# Patient Record
Sex: Female | Born: 1945 | Race: Black or African American | Hispanic: No | Smoking: Never smoker
Health system: Southern US, Community
[De-identification: ages and names within clinical notes are randomized; demographics above are authoritative.]

## PROBLEM LIST (undated history)

## (undated) DIAGNOSIS — I509 Heart failure, unspecified: Secondary | ICD-10-CM

## (undated) DIAGNOSIS — M797 Fibromyalgia: Secondary | ICD-10-CM

## (undated) DIAGNOSIS — I82409 Acute embolism and thrombosis of unspecified deep veins of unspecified lower extremity: Secondary | ICD-10-CM

## (undated) DIAGNOSIS — I2510 Atherosclerotic heart disease of native coronary artery without angina pectoris: Secondary | ICD-10-CM

## (undated) DIAGNOSIS — D649 Anemia, unspecified: Secondary | ICD-10-CM

## (undated) DIAGNOSIS — E785 Hyperlipidemia, unspecified: Secondary | ICD-10-CM

## (undated) DIAGNOSIS — N189 Chronic kidney disease, unspecified: Secondary | ICD-10-CM

## (undated) DIAGNOSIS — I739 Peripheral vascular disease, unspecified: Secondary | ICD-10-CM

## (undated) DIAGNOSIS — I8390 Asymptomatic varicose veins of unspecified lower extremity: Secondary | ICD-10-CM

## (undated) DIAGNOSIS — M1990 Unspecified osteoarthritis, unspecified site: Secondary | ICD-10-CM

## (undated) DIAGNOSIS — J45909 Unspecified asthma, uncomplicated: Secondary | ICD-10-CM

## (undated) DIAGNOSIS — M5430 Sciatica, unspecified side: Secondary | ICD-10-CM

## (undated) DIAGNOSIS — R238 Other skin changes: Secondary | ICD-10-CM

## (undated) DIAGNOSIS — R011 Cardiac murmur, unspecified: Secondary | ICD-10-CM

## (undated) DIAGNOSIS — S86019A Strain of unspecified Achilles tendon, initial encounter: Secondary | ICD-10-CM

## (undated) DIAGNOSIS — Z8719 Personal history of other diseases of the digestive system: Secondary | ICD-10-CM

## (undated) DIAGNOSIS — Z889 Allergy status to unspecified drugs, medicaments and biological substances status: Secondary | ICD-10-CM

## (undated) DIAGNOSIS — K219 Gastro-esophageal reflux disease without esophagitis: Secondary | ICD-10-CM

## (undated) DIAGNOSIS — Z9889 Other specified postprocedural states: Secondary | ICD-10-CM

## (undated) DIAGNOSIS — R233 Spontaneous ecchymoses: Secondary | ICD-10-CM

## (undated) DIAGNOSIS — G4733 Obstructive sleep apnea (adult) (pediatric): Secondary | ICD-10-CM

## (undated) DIAGNOSIS — I208 Other forms of angina pectoris: Secondary | ICD-10-CM

## (undated) DIAGNOSIS — E039 Hypothyroidism, unspecified: Secondary | ICD-10-CM

## (undated) DIAGNOSIS — K5900 Constipation, unspecified: Secondary | ICD-10-CM

## (undated) DIAGNOSIS — I219 Acute myocardial infarction, unspecified: Secondary | ICD-10-CM

## (undated) DIAGNOSIS — K5792 Diverticulitis of intestine, part unspecified, without perforation or abscess without bleeding: Secondary | ICD-10-CM

## (undated) DIAGNOSIS — R931 Abnormal findings on diagnostic imaging of heart and coronary circulation: Secondary | ICD-10-CM

## (undated) DIAGNOSIS — M5020 Other cervical disc displacement, unspecified cervical region: Secondary | ICD-10-CM

## (undated) DIAGNOSIS — I2089 Other forms of angina pectoris: Secondary | ICD-10-CM

## (undated) DIAGNOSIS — I10 Essential (primary) hypertension: Secondary | ICD-10-CM

## (undated) DIAGNOSIS — K5190 Ulcerative colitis, unspecified, without complications: Secondary | ICD-10-CM

## (undated) DIAGNOSIS — I5082 Biventricular heart failure: Secondary | ICD-10-CM

## (undated) HISTORY — DX: Atherosclerotic heart disease of native coronary artery without angina pectoris: I25.10

## (undated) HISTORY — PX: KNEE ARTHROSCOPY: SUR90

## (undated) HISTORY — PX: CHOLECYSTECTOMY: SHX55

## (undated) HISTORY — PX: ABDOMINAL HYSTERECTOMY: SHX81

## (undated) HISTORY — PX: MOLE REMOVAL: SHX2046

## (undated) HISTORY — PX: FOOT SURGERY: SHX648

## (undated) HISTORY — DX: Abnormal findings on diagnostic imaging of heart and coronary circulation: R93.1

## (undated) HISTORY — PX: OOPHORECTOMY: SHX86

## (undated) HISTORY — DX: Biventricular heart failure: I50.82

## (undated) HISTORY — PX: CARDIAC CATHETERIZATION: SHX172

## (undated) HISTORY — PX: HAMMER TOE SURGERY: SHX385

## (undated) HISTORY — PX: REPLACEMENT TOTAL KNEE: SUR1224

## (undated) HISTORY — PX: CYSTECTOMY: SUR359

## (undated) HISTORY — PX: BUNIONECTOMY: SHX129

## (undated) HISTORY — PX: TONSILLECTOMY: SUR1361

## (undated) HISTORY — PX: DILATION AND CURETTAGE OF UTERUS: SHX78

## (undated) HISTORY — DX: Acute embolism and thrombosis of unspecified deep veins of unspecified lower extremity: I82.409

## (undated) HISTORY — DX: Obstructive sleep apnea (adult) (pediatric): G47.33

## (undated) HISTORY — DX: Asymptomatic varicose veins of unspecified lower extremity: I83.90

---

## 2007-08-22 DIAGNOSIS — I219 Acute myocardial infarction, unspecified: Secondary | ICD-10-CM

## 2007-08-22 HISTORY — DX: Acute myocardial infarction, unspecified: I21.9

## 2007-08-22 HISTORY — PX: REVISION TOTAL KNEE ARTHROPLASTY: SUR1280

## 2012-05-10 ENCOUNTER — Other Ambulatory Visit (HOSPITAL_COMMUNITY): Payer: Self-pay | Admitting: *Deleted

## 2012-05-13 ENCOUNTER — Encounter (HOSPITAL_COMMUNITY)
Admission: RE | Admit: 2012-05-13 | Discharge: 2012-05-13 | Disposition: A | Discharge status: Home / Self Care | Payer: Medicare (Managed Care) | Source: Ambulatory Visit | Attending: Family Medicine | Admitting: Family Medicine

## 2012-05-13 DIAGNOSIS — D509 Iron deficiency anemia, unspecified: Secondary | ICD-10-CM | POA: Insufficient documentation

## 2012-05-13 MED ORDER — SODIUM CHLORIDE 0.9 % IV SOLN
INTRAVENOUS | Status: DC
  Administered 2012-05-13: 12:00:00 via INTRAVENOUS

## 2012-05-13 MED ORDER — FERUMOXYTOL INJECTION 510 MG/17 ML
INTRAVENOUS | Status: AC
  Administered 2012-05-13: 510 mg via INTRAVENOUS
  Filled 2012-05-13: qty 17

## 2012-05-13 MED ORDER — FERUMOXYTOL INJECTION 510 MG/17 ML
510.0000 mg | Freq: Once | INTRAVENOUS | Status: AC
  Administered 2012-05-13: 510 mg via INTRAVENOUS

## 2012-05-26 ENCOUNTER — Encounter (HOSPITAL_COMMUNITY): Payer: Self-pay

## 2012-05-26 ENCOUNTER — Emergency Department (HOSPITAL_COMMUNITY)
Admission: EM | Admit: 2012-05-26 | Discharge: 2012-05-26 | Disposition: A | Discharge status: Home / Self Care | Payer: Medicare (Managed Care) | Attending: Emergency Medicine | Admitting: Emergency Medicine

## 2012-05-26 ENCOUNTER — Telehealth (HOSPITAL_COMMUNITY): Payer: Self-pay | Admitting: Emergency Medicine

## 2012-05-26 DIAGNOSIS — M129 Arthropathy, unspecified: Secondary | ICD-10-CM | POA: Insufficient documentation

## 2012-05-26 DIAGNOSIS — R040 Epistaxis: Secondary | ICD-10-CM | POA: Insufficient documentation

## 2012-05-26 DIAGNOSIS — I509 Heart failure, unspecified: Secondary | ICD-10-CM | POA: Insufficient documentation

## 2012-05-26 DIAGNOSIS — E785 Hyperlipidemia, unspecified: Secondary | ICD-10-CM | POA: Insufficient documentation

## 2012-05-26 DIAGNOSIS — IMO0001 Reserved for inherently not codable concepts without codable children: Secondary | ICD-10-CM | POA: Insufficient documentation

## 2012-05-26 DIAGNOSIS — I10 Essential (primary) hypertension: Secondary | ICD-10-CM | POA: Insufficient documentation

## 2012-05-26 DIAGNOSIS — E119 Type 2 diabetes mellitus without complications: Secondary | ICD-10-CM | POA: Insufficient documentation

## 2012-05-26 DIAGNOSIS — E039 Hypothyroidism, unspecified: Secondary | ICD-10-CM | POA: Insufficient documentation

## 2012-05-26 DIAGNOSIS — K219 Gastro-esophageal reflux disease without esophagitis: Secondary | ICD-10-CM | POA: Insufficient documentation

## 2012-05-26 HISTORY — DX: Heart failure, unspecified: I50.9

## 2012-05-26 HISTORY — DX: Unspecified asthma, uncomplicated: J45.909

## 2012-05-26 HISTORY — DX: Other forms of angina pectoris: I20.8

## 2012-05-26 HISTORY — DX: Constipation, unspecified: K59.00

## 2012-05-26 HISTORY — DX: Hypothyroidism, unspecified: E03.9

## 2012-05-26 HISTORY — DX: Allergy status to unspecified drugs, medicaments and biological substances: Z88.9

## 2012-05-26 HISTORY — DX: Other specified postprocedural states: Z98.890

## 2012-05-26 HISTORY — DX: Diverticulitis of intestine, part unspecified, without perforation or abscess without bleeding: K57.92

## 2012-05-26 HISTORY — DX: Essential (primary) hypertension: I10

## 2012-05-26 HISTORY — DX: Unspecified osteoarthritis, unspecified site: M19.90

## 2012-05-26 HISTORY — DX: Fibromyalgia: M79.7

## 2012-05-26 HISTORY — DX: Sciatica, unspecified side: M54.30

## 2012-05-26 HISTORY — DX: Gastro-esophageal reflux disease without esophagitis: K21.9

## 2012-05-26 HISTORY — DX: Hyperlipidemia, unspecified: E78.5

## 2012-05-26 HISTORY — DX: Other cervical disc displacement, unspecified cervical region: M50.20

## 2012-05-26 HISTORY — DX: Other forms of angina pectoris: I20.89

## 2012-05-26 HISTORY — DX: Ulcerative colitis, unspecified, without complications: K51.90

## 2012-05-26 MED ORDER — AMOXICILLIN-POT CLAVULANATE 875-125 MG PO TABS
1.0000 | ORAL_TABLET | Freq: Once | ORAL | Status: AC
  Administered 2012-05-26: 1 via ORAL
  Filled 2012-05-26: qty 1

## 2012-05-26 MED ORDER — HYDRALAZINE HCL 25 MG PO TABS
25.0000 mg | ORAL_TABLET | Freq: Once | ORAL | Status: AC
  Administered 2012-05-26: 25 mg via ORAL
  Filled 2012-05-26: qty 1

## 2012-05-26 MED ORDER — OXYMETAZOLINE HCL 0.05 % NA SOLN
1.0000 | Freq: Once | NASAL | Status: AC
  Administered 2012-05-26: 1 via NASAL
  Filled 2012-05-26: qty 15

## 2012-05-26 NOTE — Discharge Instructions (Signed)
Take antibiotic as prescribed. Follow up with Dr. Suszanne Conners in his office on Monday.  You should return to the ER if your bleeding starts again.  Nosebleed Nosebleeds can be caused by many conditions including trauma, infections, polyps, foreign bodies, dry mucous membranes or climate, medications and air conditioning. Most nosebleeds occur in the front of the nose. It is because of this location that most nosebleeds can be controlled by pinching the nostrils gently and continuously. Do this for at least 10 to 20 minutes. The reason for this long continuous pressure is that you must hold it long enough for the blood to clot. If during that 10 to 20 minute time period, pressure is released, the process may have to be started again. The nosebleed may stop by itself, quit with pressure, need concentrated heating (cautery) or stop with pressure from packing. HOME CARE INSTRUCTIONS   If your nose was packed, try to maintain the pack inside until your caregiver removes it. If a gauze pack was used and it starts to fall out, gently replace or cut the end off. Do not cut if a balloon catheter was used to pack the nose. Otherwise, do not remove unless instructed.  Avoid blowing your nose for 12 hours after treatment. This could dislodge the pack or clot and start bleeding again.  If the bleeding starts again, sit up and bending forward, gently pinch the front half of your nose continuously for 20 minutes.  If bleeding was caused by dry mucous membranes, cover the inside of your nose every morning with a petroleum or antibiotic ointment. Use your little fingertip as an applicator. Do this as needed during dry weather. This will keep the mucous membranes moist and allow them to heal.  Maintain humidity in your home by using less air conditioning or using a humidifier.  Do not use aspirin or medications which make bleeding more likely. Your caregiver can give you recommendations on this.  Resume normal activities as  able but try to avoid straining, lifting or bending at the waist for several days.  If the nosebleeds become recurrent and the cause is unknown, your caregiver may suggest laboratory tests. SEEK IMMEDIATE MEDICAL CARE IF:   Bleeding recurs and cannot be controlled.  There is unusual bleeding from or bruising on other parts of the body.  You have a fever.  Nosebleeds continue.  There is any worsening of the condition which originally brought you in.  You become lightheaded, feel faint, become sweaty or vomit blood. MAKE SURE YOU:   Understand these instructions.  Will watch your condition.  Will get help right away if you are not doing well or get worse. Document Released: 05/17/2005 Document Revised: 10/30/2011 Document Reviewed: 07/09/2009 [facility] Patient Information 2013 [place], [place].

## 2012-05-26 NOTE — ED Notes (Signed)
The patient is AOx4 and comfortable with her discharge instructions. 

## 2012-05-26 NOTE — ED Provider Notes (Signed)
Medical screening examination/treatment/procedure(s) were performed by non-physician practitioner and as supervising physician I was immediately available for consultation/collaboration.   Jasmine Awe, MD 05/26/12 [phone]

## 2012-05-26 NOTE — ED Notes (Signed)
EDP at bedside  

## 2012-05-26 NOTE — ED Notes (Signed)
The patient states that her nose started bleeding spontaneously at 1000.  She denies any trauma to her face.

## 2012-05-26 NOTE — ED Provider Notes (Signed)
History     CSN: [id number]  Arrival date & time 05/26/12  0130   First MD Initiated Contact with Patient 05/26/12 0245      Chief Complaint  Patient presents with  . Epistaxis    (Consider location/radiation/quality/duration/timing/severity/associated sxs/prior treatment) HPI History provided by pt.   Pt has had intermittent epistaxis bilateral nostrils since yesterday morning.  Bleeding stops when she holds pressure but starts again when she moves around.  Has had nasal congestion and sinus pressure recently.  Is not anti-coagulated.  Denies trauma.  Past Medical History  Diagnosis Date  . Hypertension   . CHF (congestive heart failure)   . Constipation   . Hypothyroidism   . GERD (gastroesophageal reflux disease)   . Hyperlipidemia   . Hx of seasonal allergies   . Fibromyalgia   . Arthritis   . Angina at rest   . Asthma   . Diabetes mellitus   . History of cardiac cath     x3-4 (lost count)  . Herniated cervical disc   . Sciatica   . Diverticulitis   . Ulcerative colitis     Past Surgical History  Procedure Date  . Tonsillectomy   . Dilation and curettage of uterus   . Cholecystectomy   . Oophorectomy   . Cystectomy   . Knee arthroscopy   . Replacement total knee     right  . Foot surgery     right  . Bunionectomy     right  . Hammer toe surgery     right  . Mole removal     right 5th toe  . Foot surgery     joint removed from great toe and pin inserted  . Hernia repair     Family History  Problem Relation Age of Onset  . Diabetes type II Mother   . Hypertension Mother   . Hyperlipidemia Mother   . Kidney disease Mother   . Fibroids Mother     History  Substance Use Topics  . Smoking status: Never Smoker   . Smokeless tobacco: Not on file  . Alcohol Use: No    OB History    Grav Para Term Preterm Abortions TAB SAB Ect Mult Living   1    1   1         Review of Systems  All other systems reviewed and are negative.    Allergies    Ivp dye and Raspberry  Home Medications  No current outpatient prescriptions on file.  BP 183/83  Pulse 65  Temp 97 F (36.1 C) (Oral)  Resp 20  SpO2 98%  Physical Exam  Nursing note and vitals reviewed. Constitutional: She is oriented to person, place, and time. She appears well-developed and well-nourished. No distress.  HENT:  Head: Normocephalic and atraumatic.       Active epistaxis from right nostril.  Large blood clot in right nostril.  No obvious source of bleeding on initial exam.   Eyes:       Normal appearance  Neck: Normal range of motion.  Cardiovascular:       hypertensive  Pulmonary/Chest: Effort normal.  Musculoskeletal: Normal range of motion.  Neurological: She is alert and oriented to person, place, and time.  Psychiatric: She has a normal mood and affect. Her behavior is normal.    ED Course  EPISTAXIS MANAGEMENT Date/Time: 05/26/2012 6:18 AM Performed by: Otilio Miu Authorized by: Ruby Cola E Consent: Verbal consent obtained. Risks and benefits:  risks, benefits and alternatives were discussed Consent given by: patient Patient understanding: patient states understanding of the procedure being performed Patient identity confirmed: verbally with patient Patient sedated: no Treatment site: right posterior Post-procedure assessment: bleeding stopped Treatment complexity: simple Patient tolerance: Patient tolerated the procedure well with no immediate complications. Comments: 7.5 rhinorocket   (including critical care time)   Labs Reviewed - No data to display No results found.   1. Epistaxis       MDM  66yo F presents w/ epistaxis.  She is not anti-coagulated and no trauma. Active epistaxis and blood clot R nostril on exam.  Pt will blow nose, use afrin and then apply pressure for 10-15 minutes.  Will reassess shortly.    Bleeding continued after using aspirin and applying pressure.  On re-examination, still unable  to visualize source of bleed.  Rhinorocket inserted.  Pt tolerated well, was observed for 60 minutes and bleeding did not re-start.  She received her first dose of augmentin in ED and was referred to ENT.  Return precautions discussed.  Her BP was elevated in ED this morning.  She has been compliant w/ medications.  She received a dose of hydralazine which she takes TID.  She has an appt w/ her PCP on Monday.        Otilio Miu, [place] 05/26/12 [phone]

## 2012-09-18 ENCOUNTER — Other Ambulatory Visit (HOSPITAL_COMMUNITY): Payer: Self-pay | Admitting: Orthopedic Surgery

## 2012-09-18 DIAGNOSIS — M25561 Pain in right knee: Secondary | ICD-10-CM

## 2012-10-01 ENCOUNTER — Encounter (HOSPITAL_COMMUNITY): Admission: RE | Admit: 2012-10-01 | Payer: Medicare Other | Source: Ambulatory Visit

## 2012-10-01 ENCOUNTER — Encounter (HOSPITAL_COMMUNITY): Payer: Medicare Other

## 2012-10-08 ENCOUNTER — Encounter (HOSPITAL_COMMUNITY): Payer: Self-pay

## 2012-10-08 ENCOUNTER — Encounter (HOSPITAL_COMMUNITY)
Admission: RE | Admit: 2012-10-08 | Discharge: 2012-10-08 | Disposition: A | Discharge status: Home / Self Care | Payer: Medicare Other | Source: Ambulatory Visit | Attending: Orthopedic Surgery | Admitting: Orthopedic Surgery

## 2012-10-08 DIAGNOSIS — M25569 Pain in unspecified knee: Secondary | ICD-10-CM | POA: Insufficient documentation

## 2012-10-08 DIAGNOSIS — M25561 Pain in right knee: Secondary | ICD-10-CM

## 2012-10-08 MED ORDER — TECHNETIUM TC 99M MEDRONATE IV KIT
25.0000 | PACK | Freq: Once | INTRAVENOUS | Status: AC | PRN
  Administered 2012-10-08: 25 via INTRAVENOUS

## 2012-10-10 ENCOUNTER — Other Ambulatory Visit (HOSPITAL_COMMUNITY): Payer: Self-pay | Admitting: Cardiovascular Disease

## 2012-10-10 DIAGNOSIS — I119 Hypertensive heart disease without heart failure: Secondary | ICD-10-CM

## 2012-10-17 ENCOUNTER — Ambulatory Visit (HOSPITAL_COMMUNITY)
Admission: RE | Admit: 2012-10-17 | Discharge: 2012-10-17 | Disposition: A | Discharge status: Home / Self Care | Payer: Medicare Other | Source: Ambulatory Visit | Attending: Cardiovascular Disease | Admitting: Cardiovascular Disease

## 2012-10-17 DIAGNOSIS — E785 Hyperlipidemia, unspecified: Secondary | ICD-10-CM | POA: Insufficient documentation

## 2012-10-17 DIAGNOSIS — I119 Hypertensive heart disease without heart failure: Secondary | ICD-10-CM

## 2012-10-17 DIAGNOSIS — I110 Hypertensive heart disease with heart failure: Secondary | ICD-10-CM | POA: Insufficient documentation

## 2012-10-17 DIAGNOSIS — I059 Rheumatic mitral valve disease, unspecified: Secondary | ICD-10-CM | POA: Insufficient documentation

## 2012-10-17 DIAGNOSIS — E119 Type 2 diabetes mellitus without complications: Secondary | ICD-10-CM | POA: Insufficient documentation

## 2012-10-17 DIAGNOSIS — I079 Rheumatic tricuspid valve disease, unspecified: Secondary | ICD-10-CM | POA: Insufficient documentation

## 2012-10-17 DIAGNOSIS — R931 Abnormal findings on diagnostic imaging of heart and coronary circulation: Secondary | ICD-10-CM

## 2012-10-17 HISTORY — DX: Abnormal findings on diagnostic imaging of heart and coronary circulation: R93.1

## 2012-10-17 NOTE — Progress Notes (Signed)
[facility]   2D echo completed 10/17/2012.   Cindy Devynn Scheff, RDCS  

## 2012-12-03 ENCOUNTER — Other Ambulatory Visit: Payer: Self-pay | Admitting: Nurse Practitioner

## 2012-12-03 DIAGNOSIS — Z1231 Encounter for screening mammogram for malignant neoplasm of breast: Secondary | ICD-10-CM

## 2012-12-16 ENCOUNTER — Ambulatory Visit
Admission: RE | Admit: 2012-12-16 | Discharge: 2012-12-16 | Disposition: A | Discharge status: Home / Self Care | Payer: Medicare Other | Source: Ambulatory Visit | Attending: Nurse Practitioner | Admitting: Nurse Practitioner

## 2012-12-16 DIAGNOSIS — Z1231 Encounter for screening mammogram for malignant neoplasm of breast: Secondary | ICD-10-CM

## 2013-01-02 ENCOUNTER — Other Ambulatory Visit: Payer: Self-pay | Admitting: Physical Medicine and Rehabilitation

## 2013-01-02 DIAGNOSIS — M25552 Pain in left hip: Secondary | ICD-10-CM

## 2013-01-08 ENCOUNTER — Other Ambulatory Visit: Payer: Self-pay | Admitting: Physical Medicine and Rehabilitation

## 2013-01-08 ENCOUNTER — Other Ambulatory Visit: Payer: Medicare Other

## 2013-01-08 ENCOUNTER — Ambulatory Visit
Admission: RE | Admit: 2013-01-08 | Discharge: 2013-01-08 | Disposition: A | Discharge status: Home / Self Care | Payer: Medicare Other | Source: Ambulatory Visit | Attending: Physical Medicine and Rehabilitation | Admitting: Physical Medicine and Rehabilitation

## 2013-01-08 DIAGNOSIS — M25552 Pain in left hip: Secondary | ICD-10-CM

## 2013-01-17 ENCOUNTER — Telehealth: Payer: Self-pay | Admitting: Cardiovascular Disease

## 2013-01-17 NOTE — Telephone Encounter (Signed)
Barnabas Lister called requesting the last office note for this patient, faxed to [facility]. ST.

## 2013-01-28 ENCOUNTER — Encounter: Payer: Self-pay | Admitting: Cardiovascular Disease

## 2013-03-23 ENCOUNTER — Ambulatory Visit (HOSPITAL_BASED_OUTPATIENT_CLINIC_OR_DEPARTMENT_OTHER): Payer: Medicare Other | Attending: Family Medicine | Admitting: Radiology

## 2013-03-23 VITALS — Ht 68.0 in | Wt 255.0 lb

## 2013-03-23 DIAGNOSIS — G4700 Insomnia, unspecified: Secondary | ICD-10-CM | POA: Insufficient documentation

## 2013-03-23 DIAGNOSIS — G4733 Obstructive sleep apnea (adult) (pediatric): Secondary | ICD-10-CM

## 2013-03-25 ENCOUNTER — Ambulatory Visit (HOSPITAL_BASED_OUTPATIENT_CLINIC_OR_DEPARTMENT_OTHER): Payer: Medicare Other

## 2013-03-28 ENCOUNTER — Other Ambulatory Visit: Payer: Self-pay | Admitting: Internal Medicine

## 2013-03-28 DIAGNOSIS — E042 Nontoxic multinodular goiter: Secondary | ICD-10-CM

## 2013-03-29 DIAGNOSIS — G4730 Sleep apnea, unspecified: Secondary | ICD-10-CM

## 2013-03-29 DIAGNOSIS — G4700 Insomnia, unspecified: Secondary | ICD-10-CM

## 2013-03-29 NOTE — Procedures (Signed)
[name], Donna Sweeney           ACCOUNT NO.:  [id number]  MEDICAL RECORD NO.:  [id number]          PATIENT TYPE:  OUT  LOCATION:  SLEEP CENTER                 FACILITY:  [facility]  PHYSICIAN:  Skylan Gift D. Maple Hudson, MD, FCCP, FACPDATE OF BIRTH:  01/26/47  DATE OF STUDY:  03/23/2013                           NOCTURNAL POLYSOMNOGRAM  REFERRING PHYSICIAN:  Knox Royalty, MD  INDICATION FOR STUDY:  Insomnia with sleep apnea.  EPWORTH SLEEPINESS SCORE:  7/24.  BMI 38.8, weight 255 pounds, height 68 inches, neck 15 inches.  MEDICATIONS:  Home medications are charted for review.  A baseline diagnostic NPSG on August 28, 2004, recording AHI 29.6 per hour with body weight 247 pounds.  CPAP titration is now requested.  SLEEP ARCHITECTURE:  Total sleep time 341.5 minutes with sleep efficiency 87.1%.  Stage I was 3.8%, stage II 76.6%, stage III absent. REM 19.6% of total sleep time.  Sleep latency 41.5 minutes, REM latency 129 minutes.  Awake after sleep onset 6 minutes.  Arousal index 9.  Extensive bedtime medication list is charted.  Significant for, including trazodone, OxyContin, and Percocet, which may be expected to affect sleep.  RESPIRATORY DATA:  CPAP titration protocol.  CPAP was titrated to 11 CWP, AHI 0 per hour.  She wore an [employer] Pilairo mask size "1 size fit all." with heated humidifier.  OXYGEN DATA:  Snoring was prevented by CPAP and mean oxygen saturation of 94.8% on room air.  CARDIAC DATA:  Sinus rhythm with frequent PACs.  MOVEMENT-PARASOMNIA:  No significant movement disturbance.  No bathroom trips.  IMPRESSIONS-RECOMMENDATIONS: 1. Successful CPAP titration to 11 CWP, AHI 0 per hour.  She wore an F     & P Pilairo mask, 1 size fits all.  Heated humidifier.  Snoring was     prevented and mean oxygen saturation held 94.8% on room air. 2. Baseline sleep study done elsewhere on August 28, 2004, had     recorded AHI 29.6 per hour.  Body weight was 247 pounds for that     study. 3. She presented an extensive list of medications taken at bedtime     including several, as noted above, which may     potentially affect sleep.  She describes her sleep problem on the     intake information sheet as frequent waking and difficulty     maintaining sleep through the night, then daytime sleepiness.     Tayden Duran D. Maple Hudson, MD, [facility], FACP Diplomate, American Board of Sleep Medicine    CDY/MEDQ  D:  03/29/2013 09:47:40  T:  03/29/2013 12:05:51  Job:  [id number]

## 2013-03-31 ENCOUNTER — Ambulatory Visit
Admission: RE | Admit: 2013-03-31 | Discharge: 2013-03-31 | Disposition: A | Discharge status: Home / Self Care | Payer: Medicare Other | Source: Ambulatory Visit | Attending: Internal Medicine | Admitting: Internal Medicine

## 2013-03-31 ENCOUNTER — Other Ambulatory Visit: Payer: Self-pay | Admitting: Internal Medicine

## 2013-03-31 DIAGNOSIS — E042 Nontoxic multinodular goiter: Secondary | ICD-10-CM

## 2013-04-22 ENCOUNTER — Other Ambulatory Visit (HOSPITAL_COMMUNITY): Payer: Self-pay | Admitting: *Deleted

## 2013-04-23 ENCOUNTER — Ambulatory Visit (HOSPITAL_COMMUNITY)
Admission: RE | Admit: 2013-04-23 | Discharge: 2013-04-23 | Disposition: A | Discharge status: Home / Self Care | Payer: Medicare Other | Source: Ambulatory Visit | Attending: Nephrology | Admitting: Nephrology

## 2013-04-23 DIAGNOSIS — D509 Iron deficiency anemia, unspecified: Secondary | ICD-10-CM | POA: Insufficient documentation

## 2013-04-23 DIAGNOSIS — N189 Chronic kidney disease, unspecified: Secondary | ICD-10-CM | POA: Insufficient documentation

## 2013-04-23 MED ORDER — SODIUM CHLORIDE 0.9 % IV SOLN
1020.0000 mg | Freq: Once | INTRAVENOUS | Status: AC
  Administered 2013-04-23: 1020 mg via INTRAVENOUS
  Filled 2013-04-23: qty 34

## 2013-05-21 ENCOUNTER — Encounter: Payer: Self-pay | Admitting: Cardiology

## 2013-05-21 DIAGNOSIS — E785 Hyperlipidemia, unspecified: Secondary | ICD-10-CM | POA: Insufficient documentation

## 2013-05-21 DIAGNOSIS — G4733 Obstructive sleep apnea (adult) (pediatric): Secondary | ICD-10-CM | POA: Insufficient documentation

## 2013-05-21 DIAGNOSIS — Z9889 Other specified postprocedural states: Secondary | ICD-10-CM

## 2013-05-21 DIAGNOSIS — I10 Essential (primary) hypertension: Secondary | ICD-10-CM

## 2013-05-21 DIAGNOSIS — I2510 Atherosclerotic heart disease of native coronary artery without angina pectoris: Secondary | ICD-10-CM | POA: Insufficient documentation

## 2013-05-28 ENCOUNTER — Encounter: Payer: Self-pay | Admitting: Cardiovascular Disease

## 2013-05-28 ENCOUNTER — Ambulatory Visit (INDEPENDENT_AMBULATORY_CARE_PROVIDER_SITE_OTHER): Payer: Medicare Other | Admitting: Cardiovascular Disease

## 2013-05-28 VITALS — BP 120/86 | HR 77 | Ht 68.0 in | Wt 268.9 lb

## 2013-05-28 DIAGNOSIS — R079 Chest pain, unspecified: Secondary | ICD-10-CM

## 2013-05-28 DIAGNOSIS — E785 Hyperlipidemia, unspecified: Secondary | ICD-10-CM

## 2013-05-28 DIAGNOSIS — N184 Chronic kidney disease, stage 4 (severe): Secondary | ICD-10-CM | POA: Insufficient documentation

## 2013-05-28 DIAGNOSIS — I10 Essential (primary) hypertension: Secondary | ICD-10-CM

## 2013-05-28 DIAGNOSIS — G4733 Obstructive sleep apnea (adult) (pediatric): Secondary | ICD-10-CM

## 2013-05-28 DIAGNOSIS — I2510 Atherosclerotic heart disease of native coronary artery without angina pectoris: Secondary | ICD-10-CM

## 2013-05-28 MED ORDER — CARVEDILOL 25 MG PO TABS: 37.5000 mg | ORAL_TABLET | Freq: Two times a day (BID) | ORAL | Status: DC

## 2013-05-28 NOTE — Progress Notes (Signed)
Patient ID: Donna Sweeney, female   DOB: 04-Jun-1946, 67 y.o.   MRN: [id number]       HPI: Donna Sweeney, is a 67 y.o. female who presents for a six-month cardiology evaluation.  Donna Sweeney is a 67 year old African American female who has a history of obesity, long-standing history of hypertension, well as a history of congestive heart failure and while she lived in [place] was enrolled in a heart failure clinic. She has a long-standing history of diabetes mellitus in excess of 30 years, a history of ventral hernia, as well as severe obstructive sleep apnea for which he utilizes CPAP therapy. An echo Doppler study in February 2014 showed moderate LVH with an ejection fraction at 60-65%. She had grade 1 diastolic dysfunction and his abnormal tissue Doppler E. a ratio greater than 20 suggesting markedly elevated LV filling pressures. She did have severe leg dilatation by volume assessment. There was very mild pulmonary hypertension.  Since I last saw her, she tells me her renal insufficiency has progressed from grade 3 to grade 4. Her GFR had reduced down to a nadir of 16 and is now approximately 22. She continues to have some difficulty with hypertension and Dr. Kathrene Bongo had recently increased her Diovan from 160-320 mg daily. The patient tells me when she goes to pain management at times her blood pressure has ranged from 170-200 systolically. She does note shortness of breath with walking. She does note some intermittent leg swelling. Apparently her renal function worsened with further increase of her diuretic and apparently this has been reduced back to 80 mg once a day from twice a day. She denies exertional angina symptoms. There is exertional shortness of breath. At times she notes a vague pleuritic-like sharp transient 8 with deep breathing in her right back. She uses her CPAP 100% of the time. He does note rare palpitations.   Past Medical History  Diagnosis Date  .  Hypertension   . CHF (congestive heart failure)   . Constipation   . Hypothyroidism   . GERD (gastroesophageal reflux disease)   . Hyperlipidemia   . Hx of seasonal allergies   . Fibromyalgia   . Arthritis   . Angina at rest   . Asthma   . Diabetes mellitus   . History of cardiac cath [phone]    non obstructive CAD (done in [place]) EF 55%  . Herniated cervical disc   . Sciatica   . Diverticulitis   . Ulcerative colitis   . CAD (coronary artery disease)     non obstructive disease  . Biventricular heart failure   . OSA (obstructive sleep apnea)   . Echocardiogram abnormal 10/17/12    EF 60-65%, grade I diastolic dysfunction, trivial MR, severely dilated LA    Past Surgical History  Procedure Laterality Date  . Tonsillectomy    . Dilation and curettage of uterus    . Cholecystectomy    . Oophorectomy    . Cystectomy    . Knee arthroscopy    . Replacement total knee      right  . Foot surgery      right  . Bunionectomy      right  . Hammer toe surgery      right  . Mole removal      right 5th toe  . Foot surgery      joint removed from great toe and pin inserted  . Hernia repair    . Cardiac catheterization  2013, 2005, 1999    non obstructive CAD    Allergies  Allergen Reactions  . Ivp Dye [Iodinated Diagnostic Agents] Shortness Of Breath, Itching and Swelling  . Raspberry Shortness Of Breath, Itching and Swelling    Current Outpatient Prescriptions  Medication Sig Dispense Refill  . albuterol (PROVENTIL HFA;VENTOLIN HFA) 108 (90 BASE) MCG/ACT inhaler Inhale 2 puffs into the lungs every 6 (six) hours as needed. For shortness of breath      . aspirin 81 MG chewable tablet Chew 81 mg by mouth daily.      . calcium-vitamin D (OSCAL WITH D) 500-200 MG-UNIT per tablet Take 1 tablet by mouth 2 (two) times daily.      . carvedilol (COREG) 25 MG tablet Take 25 mg by mouth 2 (two) times daily with a meal.      . cholecalciferol (VITAMIN D) 1000 UNITS tablet Take  1,000 Units by mouth daily.      . colchicine 0.6 MG tablet Take 0.6 mg by mouth daily.      . DULoxetine (CYMBALTA) 60 MG capsule Take 60 mg by mouth daily.      . fenofibrate (TRICOR) 145 MG tablet Take 145 mg by mouth daily.      . furosemide (LASIX) 40 MG tablet Take 80 mg by mouth daily.       Marland Kitchen gabapentin (NEURONTIN) 300 MG capsule Take 300 mg by mouth 3 (three) times daily.      . hydrALAZINE (APRESOLINE) 50 MG tablet Take 50 mg by mouth 3 (three) times daily.      . insulin glargine (LANTUS) 100 UNIT/ML injection Inject 44 Units into the skin at bedtime.       Marland Kitchen levothyroxine (SYNTHROID, LEVOTHROID) 100 MCG tablet Take 100 mcg by mouth daily.      . Menthol, Topical Analgesic, (BIOFREEZE ROLL-ON) 4 % GEL Apply 1 application topically 2 (two) times daily as needed.      . nitroGLYCERIN (NITROSTAT) 0.4 MG SL tablet Place 0.4 mg under the tongue every 5 (five) minutes as needed. For chest pain      . oxyCODONE (OXYCONTIN) 15 MG TB12 Take 15 mg by mouth every 12 (twelve) hours.      Marland Kitchen oxyCODONE-acetaminophen (PERCOCET/ROXICET) 5-325 MG per tablet Take 1 tablet by mouth every 4 (four) hours as needed. For pain      . pantoprazole (PROTONIX) 40 MG tablet Take 40 mg by mouth daily.      . potassium chloride SA (K-DUR,KLOR-CON) 20 MEQ tablet Take 20 mEq by mouth daily.      Marland Kitchen senna-docusate (SENOKOT-S) 8.6-50 MG per tablet Take 2 tablets by mouth at bedtime.      . simvastatin (ZOCOR) 40 MG tablet Take 40 mg by mouth every evening.      . traZODone (DESYREL) 50 MG tablet Take 25 mg by mouth at bedtime.      . valsartan (DIOVAN) 160 MG tablet Take 320 mg by mouth daily.       . cetirizine (ZYRTEC) 1 MG/ML syrup Take 2 mLs by mouth daily.      Marland Kitchen VICTOZA 18 MG/3ML SOPN Inject 1.2 Units into the skin daily.       No current facility-administered medications for this visit.    History   Social History  . Marital Status: Divorced    Spouse Name: N/A    Number of Children: N/A  . Years of  Education: N/A   Occupational History  . Not on file.  Social History Main Topics  . Smoking status: Never Smoker   . Smokeless tobacco: Not on file  . Alcohol Use: No  . Drug Use: No  . Sexual Activity: No   Other Topics Concern  . Not on file   Social History Narrative  . No narrative on file    Family History  Problem Relation Age of Onset  . Diabetes type II Mother   . Hypertension Mother   . Hyperlipidemia Mother   . Kidney disease Mother   . Fibroids Mother   . Hypertension Sister   . Hypertension Brother   . Hypertension Sister   . Hypertension Sister     ROS is negative for fevers, chills or night sweats. She does admit to weight gain. She does admit to some occasional swelling. She denies skin rash. She denies changes in her vision. She does note a rare palpitation. She denies presyncope or syncope. She admits to a sharp intermittent pleuritic-like chest ache. His wheezing. She does note more shortness of breath with lying flat than sitting up. The denies abdominal pain. She does have a ventral hernia. She denies GU bleeding. She denies change in bowel or bladder habits. She does note some occasional ankle swelling. She does have arthritic issues with carpal tunnel bilaterally and has been wearing braces on her wrists and ankles. He denies neurologic symptoms.  She is diabetic.  Other comprehensive 12 point system review is negative.  PE BP 120/86  Pulse 77  Ht 5\' 8"  (1.727 m)  Wt 268 lb 14.4 oz (121.972 kg)  BMI 40.9 kg/m2   repeat blood pressure 150/86 General: Alert, oriented, no distress.  Skin: normal turgor, no rashes HEENT: Normocephalic, atraumatic. Pupils round and reactive; sclera anicteric;no lid lag.  Nose without nasal septal hypertrophy Mouth/Parynx benign; Mallinpatti scale 3/4 Neck: No JVD, no carotid briuts Lungs: clear to ausculatation and percussion; no wheezing or rales Heart: RRR, s1 s2 normal 1/6 systolic murmur Abdomen: Moderate  central adiposity with question diastases recti versus a hernia.soft, nontender; no hepatosplenomehaly, BS+; abdominal aorta nontender and not dilated by palpation. Pulses 2+ Extremities: She was wearing splints on her wrists bilaterally for carpal tunnel syndrome and on her ankles. Mild edema ; no clubbing cyanosis, Homan's sign negative  Neurologic: grossly nonfocal  ECG: Sinus rhythm at 77 beats per minute with sinus arrhythmia and occasional PVCs.  LABS:  BMET No results found for this basename: na, k, cl, co2, glucose, bun, creatinine, calcium, gfrnonaa, gfraa     Hepatic Function Panel  No results found for this basename: prot, albumin, ast, alt, alkphos, bilitot, bilidir, ibili     CBC No results found for this basename: wbc, rbc, hgb, hct, plt, mcv, mch, mchc, rdw, neutrabs, lymphsabs, monoabs, eosabs, basosabs     BNP No results found for this basename: probnp    Lipid Panel  No results found for this basename: chol, trig, hdl, cholhdl, vldl, ldlcalc     RADIOLOGY: No results found.    ASSESSMENT AND PLAN: [name] is a 67 year old African American female with a history of morbid obesity with a BMI of 40.9, hypertension, normal systolic function with grade 1 diastolic dysfunction with documented elevated filling pressures by tissue Doppler are does note some shortness of breath. Her renal function aggress to now class IV. She is still hypertensive today and she states when she goes to pain management her blood pressure has been recorded at 170-200 mm systolically. She is using her CPAP with  100% compliance with reference to her severe obstructive sleep apnea. She does note shortness of breath with walking most likely contributed by her diastolic dysfunction. I'm recommending further titration of her carvedilol to 37.5 mg twice a day. She tells me she will be seeing Dr. Kathrene Bongo next month. I will see her in January 2015 for followup urologic  evaluation.     Donna Bihari, MD, [facility]  05/28/2013 10:14 AM

## 2013-05-28 NOTE — Patient Instructions (Signed)
INCREASE Carvedilol 25mg  to 1 1/2 tablets twice a day.  Your physician recommends that you schedule a follow-up appointment in: January 2015.

## 2013-06-06 ENCOUNTER — Ambulatory Visit
Admission: RE | Admit: 2013-06-06 | Discharge: 2013-06-06 | Disposition: A | Discharge status: Home / Self Care | Payer: Medicare Other | Source: Ambulatory Visit | Attending: Gastroenterology | Admitting: Gastroenterology

## 2013-06-06 ENCOUNTER — Other Ambulatory Visit: Payer: Self-pay | Admitting: Gastroenterology

## 2013-06-06 DIAGNOSIS — K5900 Constipation, unspecified: Secondary | ICD-10-CM

## 2013-06-06 DIAGNOSIS — Z8719 Personal history of other diseases of the digestive system: Secondary | ICD-10-CM

## 2013-06-09 ENCOUNTER — Encounter (HOSPITAL_COMMUNITY): Payer: Self-pay | Admitting: Pharmacy Technician

## 2013-06-13 ENCOUNTER — Encounter (HOSPITAL_COMMUNITY): Payer: Self-pay | Admitting: *Deleted

## 2013-06-30 ENCOUNTER — Other Ambulatory Visit: Payer: Self-pay | Admitting: Gastroenterology

## 2013-07-01 ENCOUNTER — Emergency Department (HOSPITAL_COMMUNITY)
Admission: EM | Admit: 2013-07-01 | Discharge: 2013-07-01 | Disposition: A | Discharge status: Home / Self Care | Payer: Medicare Other | Attending: Emergency Medicine | Admitting: Emergency Medicine

## 2013-07-01 ENCOUNTER — Ambulatory Visit (HOSPITAL_COMMUNITY): Admission: RE | Admit: 2013-07-01 | Payer: Medicare Other | Source: Ambulatory Visit | Admitting: Gastroenterology

## 2013-07-01 ENCOUNTER — Encounter (HOSPITAL_COMMUNITY): Payer: Self-pay | Admitting: Anesthesiology

## 2013-07-01 ENCOUNTER — Encounter (HOSPITAL_COMMUNITY): Payer: Self-pay | Admitting: Emergency Medicine

## 2013-07-01 DIAGNOSIS — Z79899 Other long term (current) drug therapy: Secondary | ICD-10-CM | POA: Insufficient documentation

## 2013-07-01 DIAGNOSIS — S86019A Strain of unspecified Achilles tendon, initial encounter: Secondary | ICD-10-CM

## 2013-07-01 DIAGNOSIS — Z9889 Other specified postprocedural states: Secondary | ICD-10-CM | POA: Insufficient documentation

## 2013-07-01 DIAGNOSIS — Z862 Personal history of diseases of the blood and blood-forming organs and certain disorders involving the immune mechanism: Secondary | ICD-10-CM | POA: Insufficient documentation

## 2013-07-01 DIAGNOSIS — I2510 Atherosclerotic heart disease of native coronary artery without angina pectoris: Secondary | ICD-10-CM | POA: Insufficient documentation

## 2013-07-01 DIAGNOSIS — R4182 Altered mental status, unspecified: Secondary | ICD-10-CM | POA: Insufficient documentation

## 2013-07-01 DIAGNOSIS — E785 Hyperlipidemia, unspecified: Secondary | ICD-10-CM | POA: Insufficient documentation

## 2013-07-01 DIAGNOSIS — J45909 Unspecified asthma, uncomplicated: Secondary | ICD-10-CM | POA: Insufficient documentation

## 2013-07-01 DIAGNOSIS — I509 Heart failure, unspecified: Secondary | ICD-10-CM | POA: Insufficient documentation

## 2013-07-01 DIAGNOSIS — Z9109 Other allergy status, other than to drugs and biological substances: Secondary | ICD-10-CM | POA: Insufficient documentation

## 2013-07-01 DIAGNOSIS — E162 Hypoglycemia, unspecified: Secondary | ICD-10-CM

## 2013-07-01 DIAGNOSIS — IMO0002 Reserved for concepts with insufficient information to code with codable children: Secondary | ICD-10-CM | POA: Insufficient documentation

## 2013-07-01 DIAGNOSIS — Z8669 Personal history of other diseases of the nervous system and sense organs: Secondary | ICD-10-CM | POA: Insufficient documentation

## 2013-07-01 DIAGNOSIS — I252 Old myocardial infarction: Secondary | ICD-10-CM | POA: Insufficient documentation

## 2013-07-01 DIAGNOSIS — Z7982 Long term (current) use of aspirin: Secondary | ICD-10-CM | POA: Insufficient documentation

## 2013-07-01 DIAGNOSIS — Z794 Long term (current) use of insulin: Secondary | ICD-10-CM | POA: Insufficient documentation

## 2013-07-01 DIAGNOSIS — K219 Gastro-esophageal reflux disease without esophagitis: Secondary | ICD-10-CM | POA: Insufficient documentation

## 2013-07-01 DIAGNOSIS — N189 Chronic kidney disease, unspecified: Secondary | ICD-10-CM | POA: Insufficient documentation

## 2013-07-01 DIAGNOSIS — M129 Arthropathy, unspecified: Secondary | ICD-10-CM | POA: Insufficient documentation

## 2013-07-01 DIAGNOSIS — E1169 Type 2 diabetes mellitus with other specified complication: Secondary | ICD-10-CM | POA: Insufficient documentation

## 2013-07-01 DIAGNOSIS — E039 Hypothyroidism, unspecified: Secondary | ICD-10-CM | POA: Insufficient documentation

## 2013-07-01 DIAGNOSIS — Z95818 Presence of other cardiac implants and grafts: Secondary | ICD-10-CM | POA: Insufficient documentation

## 2013-07-01 DIAGNOSIS — E119 Type 2 diabetes mellitus without complications: Secondary | ICD-10-CM | POA: Insufficient documentation

## 2013-07-01 DIAGNOSIS — I209 Angina pectoris, unspecified: Secondary | ICD-10-CM | POA: Insufficient documentation

## 2013-07-01 DIAGNOSIS — IMO0001 Reserved for inherently not codable concepts without codable children: Secondary | ICD-10-CM | POA: Insufficient documentation

## 2013-07-01 DIAGNOSIS — I129 Hypertensive chronic kidney disease with stage 1 through stage 4 chronic kidney disease, or unspecified chronic kidney disease: Secondary | ICD-10-CM | POA: Insufficient documentation

## 2013-07-01 HISTORY — DX: Peripheral vascular disease, unspecified: I73.9

## 2013-07-01 HISTORY — DX: Strain of unspecified achilles tendon, initial encounter: S86.019A

## 2013-07-01 HISTORY — DX: Anemia, unspecified: D64.9

## 2013-07-01 HISTORY — DX: Acute myocardial infarction, unspecified: I21.9

## 2013-07-01 HISTORY — DX: Chronic kidney disease, unspecified: N18.9

## 2013-07-01 LAB — URINE MICROSCOPIC-ADD ON

## 2013-07-01 LAB — URINALYSIS, ROUTINE W REFLEX MICROSCOPIC
Bilirubin Urine: NEGATIVE
Hgb urine dipstick: NEGATIVE
Nitrite: NEGATIVE
Protein, ur: >300 mg/dL — AB
Specific Gravity, Urine: 1.02 (ref 1.005–1.030)
Urobilinogen, UA: 0.2 mg/dL (ref 0.0–1.0)

## 2013-07-01 LAB — POCT I-STAT, CHEM 8
BUN: 41 mg/dL — ABNORMAL HIGH (ref 6–23)
Chloride: 100 mEq/L (ref 96–112)
Creatinine, Ser: 2.2 mg/dL — ABNORMAL HIGH (ref 0.50–1.10)
Sodium: 138 mEq/L (ref 135–145)
TCO2: 27 mmol/L (ref 0–100)

## 2013-07-01 SURGERY — COLONOSCOPY WITH PROPOFOL
Anesthesia: Monitor Anesthesia Care

## 2013-07-01 NOTE — ED Notes (Signed)
Per EMS pt hypoglycemic 42, dropped to 24 gave oral glucose, peanut butter jelly sandwich, 2 glasses of OJ with increase to 51. Pt a/o x4

## 2013-07-01 NOTE — Anesthesia Preprocedure Evaluation (Deleted)
Anesthesia Evaluation  Patient identified by MRN, date of birth, ID band Patient awake    Reviewed: Allergy & Precautions, H&P , NPO status , Patient's Chart, lab work & pertinent test results, reviewed documented beta blocker date and time   Airway Mallampati: II TM Distance: >3 FB Neck ROM: full    Dental no notable dental hx.    Pulmonary asthma , sleep apnea ,  breath sounds clear to auscultation  Pulmonary exam normal       Cardiovascular hypertension, Pt. on medications and Pt. on home beta blockers + angina at rest + CAD, + Past MI and +CHF Rhythm:regular Rate:Normal  History cardiac cath [phone] showing non-obstructive CAD  EF 55%.  Echo 60% EF.  Grade 1 diastolic dysfunction.  Severely dilated LA   Neuro/Psych negative neurological ROS  negative psych ROS   GI/Hepatic negative GI ROS, Neg liver ROS, GERD-  Medicated and Controlled,  Endo/Other  diabetes, Well Controlled, Type 2, Insulin DependentHypothyroidism   Renal/GU CRFRenal diseaseStage 4 chronic renal disease  negative genitourinary   Musculoskeletal   Abdominal   Peds  Hematology negative hematology ROS (+) anemia ,   Anesthesia Other Findings   Reproductive/Obstetrics negative OB ROS                           Anesthesia Physical Anesthesia Plan  ASA: III  Anesthesia Plan: MAC   Post-op Pain Management:    Induction:   Airway Management Planned: Simple Face Mask  Additional Equipment:   Intra-op Plan:   Post-operative Plan:   Informed Consent: I have reviewed the patients History and Physical, chart, labs and discussed the procedure including the risks, benefits and alternatives for the proposed anesthesia with the patient or authorized representative who has indicated his/her understanding and acceptance.   Dental Advisory Given  Plan Discussed with: CRNA and Surgeon  Anesthesia Plan Comments:          Anesthesia Quick Evaluation

## 2013-07-01 NOTE — ED Notes (Signed)
Pt states had an appt for a colonoscopy this am so she NPO, her sugar dropped and she was confused and passed out at home with family found her. Pt alert and lethargic now.

## 2013-07-01 NOTE — ED Provider Notes (Signed)
CSN: [id number]     Arrival date & time 07/01/13  1142 History   First MD Initiated Contact with Patient 07/01/13 1159     Chief Complaint  Patient presents with  . Hypoglycemia   (Consider location/radiation/quality/duration/timing/severity/associated sxs/prior Treatment) HPI Comments: Donna Sweeney is a 67 y.o. female who is here for evaluation of altered mental status. The patient was due to have a colonoscopy this morning, and was found in her home by her daughter, with altered mental status. The patient is now alert and conversant, and states that she took her Lantus last evening, but did not eat after midnight, as was recommended in preparation for the colonoscopy. The patient is asymptomatic on arrival to the emergency department. She had been treated prior to arrival by EMS with oral nutrition, with improvement of her CBG. She has not had any recent illnesses. There are no other known modifying factors.   Patient is a 67 y.o. female presenting with hypoglycemia.  Hypoglycemia   Past Medical History  Diagnosis Date  . Hypertension   . CHF (congestive heart failure)   . Constipation   . Hypothyroidism   . GERD (gastroesophageal reflux disease)   . Hyperlipidemia   . Hx of seasonal allergies   . Fibromyalgia   . Arthritis   . Angina at rest   . Asthma   . Diabetes mellitus   . History of cardiac cath [phone]    non obstructive CAD (done in [place]) EF 55%  . Herniated cervical disc   . Sciatica   . Diverticulitis   . Ulcerative colitis   . CAD (coronary artery disease)     non obstructive disease  . Biventricular heart failure   . OSA (obstructive sleep apnea)   . Echocardiogram abnormal 10/17/12    EF 60-65%, grade I diastolic dysfunction, trivial MR, severely dilated LA  . Myocardial infarction   . Peripheral vascular disease   . Anemia   . Chronic kidney disease    Past Surgical History  Procedure Laterality Date  . Tonsillectomy    . Dilation and  curettage of uterus    . Cholecystectomy    . Oophorectomy    . Cystectomy    . Knee arthroscopy    . Replacement total knee      right  . Foot surgery      right  . Bunionectomy      right  . Hammer toe surgery      right  . Mole removal      right 5th toe  . Foot surgery      joint removed from great toe and pin inserted  . Hernia repair    . Cardiac catheterization  2013, 2005, 1999    non obstructive CAD   Family History  Problem Relation Age of Onset  . Diabetes type II Mother   . Hypertension Mother   . Hyperlipidemia Mother   . Kidney disease Mother   . Fibroids Mother   . Hypertension Sister   . Hypertension Brother   . Hypertension Sister   . Hypertension Sister    History  Substance Use Topics  . Smoking status: Never Smoker   . Smokeless tobacco: Not on file  . Alcohol Use: No   OB History   Grav Para Term Preterm Abortions TAB SAB Ect Mult Living   1    1   1        Review of Systems  All other systems reviewed and  are negative.    Allergies  Ivp dye and Raspberry  Home Medications   Current Outpatient Rx  Name  Route  Sig  Dispense  Refill  . albuterol (PROVENTIL HFA;VENTOLIN HFA) 108 (90 BASE) MCG/ACT inhaler   Inhalation   Inhale 2 puffs into the lungs every 6 (six) hours as needed. For shortness of breath         . aspirin 81 MG chewable tablet   Oral   Chew 81 mg by mouth every morning.          . calcium-vitamin D (OSCAL WITH D) 500-200 MG-UNIT per tablet   Oral   Take 1 tablet by mouth 2 (two) times daily.         . carvedilol (COREG) 25 MG tablet   Oral   Take 37.5 mg by mouth 2 (two) times daily with a meal.         . cetirizine (ZYRTEC) 1 MG/ML syrup   Oral   Take 10 mg by mouth daily.          . cholecalciferol (VITAMIN D) 1000 UNITS tablet   Oral   Take 1,000 Units by mouth daily.         . colchicine 0.6 MG tablet   Oral   Take 0.6 mg by mouth daily.         . cycloSPORINE (RESTASIS) 0.05 %  ophthalmic emulsion   Both Eyes   Place 1 drop into both eyes 2 (two) times daily.         . DULoxetine (CYMBALTA) 60 MG capsule   Oral   Take 60 mg by mouth every evening.          . fenofibrate (TRICOR) 145 MG tablet   Oral   Take 145 mg by mouth every morning.          . fexofenadine (ALLEGRA) 180 MG tablet   Oral   Take 180 mg by mouth daily.         . fish oil-omega-3 fatty acids 1000 MG capsule   Oral   Take 1 g by mouth 3 (three) times daily.         . fluticasone (FLONASE) 50 MCG/ACT nasal spray   Nasal   Place 2 sprays into the nose daily.         . furosemide (LASIX) 40 MG tablet   Oral   Take 40 mg by mouth every morning.          . gabapentin (NEURONTIN) 300 MG capsule   Oral   Take 300 mg by mouth 3 (three) times daily.         . hydrALAZINE (APRESOLINE) 50 MG tablet   Oral   Take 50 mg by mouth 3 (three) times daily.         . insulin glargine (LANTUS) 100 UNIT/ML injection   Subcutaneous   Inject 44 Units into the skin at bedtime.          Marland Kitchen lactulose, encephalopathy, (GENERLAC) 10 GM/15ML SOLN   Oral   Take 10 g by mouth 2 (two) times daily.          Marland Kitchen levothyroxine (SYNTHROID, LEVOTHROID) 100 MCG tablet   Oral   Take 100 mcg by mouth daily before breakfast.          . Oxycodone HCl 10 MG TABS   Oral   Take 10 mg by mouth 2 (two) times daily.         Marland Kitchen  oxyCODONE-acetaminophen (PERCOCET/ROXICET) 5-325 MG per tablet   Oral   Take 1 tablet by mouth every 4 (four) hours as needed. For pain         . pantoprazole (PROTONIX) 40 MG tablet   Oral   Take 40 mg by mouth daily.         . polyethylene glycol (MIRALAX / GLYCOLAX) packet   Oral   Take 17 g by mouth daily.         . potassium chloride SA (K-DUR,KLOR-CON) 20 MEQ tablet   Oral   Take 20 mEq by mouth daily.         . simvastatin (ZOCOR) 40 MG tablet   Oral   Take 40 mg by mouth every evening.         . traZODone (DESYREL) 50 MG tablet   Oral    Take 50 mg by mouth at bedtime.          . valsartan (DIOVAN) 320 MG tablet   Oral   Take 320 mg by mouth at bedtime.         Marland Kitchen VICTOZA 18 MG/3ML SOPN   Subcutaneous   Inject 1.2 Units into the skin daily.         . nitroGLYCERIN (NITROSTAT) 0.4 MG SL tablet   Sublingual   Place 0.4 mg under the tongue every 5 (five) minutes as needed. For chest pain          BP 150/70  Pulse 64  Temp(Src) 97.2 F (36.2 C)  Resp 20  SpO2 96% Physical Exam  Nursing note and vitals reviewed. Constitutional: She is oriented to person, place, and time. She appears well-developed.  Elderly, appears older than stated age  HENT:  Head: Normocephalic and atraumatic.  Eyes: Conjunctivae and EOM are normal. Pupils are equal, round, and reactive to light.  Neck: Normal range of motion and phonation normal. Neck supple.  Cardiovascular: Normal rate, regular rhythm and intact distal pulses.   Pulmonary/Chest: Effort normal and breath sounds normal. She exhibits no tenderness.  Abdominal: Soft. She exhibits no distension. There is no tenderness. There is no guarding.  Musculoskeletal: Normal range of motion.  Neurological: She is alert and oriented to person, place, and time. She exhibits normal muscle tone.  Skin: Skin is warm and dry.  Psychiatric: She has a normal mood and affect. Her behavior is normal. Judgment and thought content normal.    ED Course  Procedures (including critical care time) Labs Review Labs Reviewed  POCT I-STAT, CHEM 8 - Abnormal; Notable for the following:    BUN 41 (*)    Creatinine, Ser 2.20 (*)    Glucose, Bld 178 (*)    Calcium, Ion 1.10 (*)    All other components within normal limits  URINE CULTURE  URINALYSIS, ROUTINE W REFLEX MICROSCOPIC   Imaging Review No results found.  EKG Interpretation   None       MDM   1. Hypoglycemia      Hypoglycemia, consistent with not eating after taking usual dose of Lantus. She had been prepping for a  colonoscopy. There is no evidence for significant infection, metabolic instability, or hemodynamic instability.   Nursing Notes Reviewed/ Care Coordinated, and agree without changes. Applicable Imaging Reviewed.  Interpretation of Laboratory Data incorporated into ED treatment   Plan: Home Medications- usual; Home Treatments and Observation- rest, fluids, usual diet; return here if the recommended treatment, does not improve the symptoms; Recommended follow up- PCP check up in  1 week     Flint Melter, MD 07/01/13 1620

## 2013-07-02 LAB — GLUCOSE, CAPILLARY: Glucose-Capillary: 132 mg/dL — ABNORMAL HIGH (ref 70–99)

## 2013-07-02 LAB — URINE CULTURE: Culture: NO GROWTH

## 2013-08-19 ENCOUNTER — Encounter (HOSPITAL_COMMUNITY): Payer: Self-pay | Admitting: Pharmacy Technician

## 2013-08-19 ENCOUNTER — Encounter (HOSPITAL_COMMUNITY): Payer: Self-pay | Admitting: *Deleted

## 2013-09-02 ENCOUNTER — Other Ambulatory Visit: Payer: Self-pay | Admitting: Gastroenterology

## 2013-09-03 ENCOUNTER — Ambulatory Visit (INDEPENDENT_AMBULATORY_CARE_PROVIDER_SITE_OTHER): Payer: Medicare Other | Admitting: Cardiovascular Disease

## 2013-09-03 ENCOUNTER — Encounter: Payer: Self-pay | Admitting: Cardiovascular Disease

## 2013-09-03 VITALS — BP 134/78 | HR 69 | Ht 68.0 in | Wt 274.2 lb

## 2013-09-03 DIAGNOSIS — R002 Palpitations: Secondary | ICD-10-CM

## 2013-09-03 DIAGNOSIS — I2510 Atherosclerotic heart disease of native coronary artery without angina pectoris: Secondary | ICD-10-CM

## 2013-09-03 DIAGNOSIS — I10 Essential (primary) hypertension: Secondary | ICD-10-CM

## 2013-09-03 DIAGNOSIS — N184 Chronic kidney disease, stage 4 (severe): Secondary | ICD-10-CM

## 2013-09-03 DIAGNOSIS — G4733 Obstructive sleep apnea (adult) (pediatric): Secondary | ICD-10-CM

## 2013-09-03 DIAGNOSIS — E785 Hyperlipidemia, unspecified: Secondary | ICD-10-CM

## 2013-09-03 DIAGNOSIS — I119 Hypertensive heart disease without heart failure: Secondary | ICD-10-CM

## 2013-09-03 MED ORDER — CARVEDILOL 25 MG PO TABS: ORAL_TABLET | ORAL | Status: DC

## 2013-09-03 NOTE — Addendum Note (Signed)
Addended by: Chezney Huether on: 09/03/2013 12:09 PM   Modules accepted: Orders  

## 2013-09-03 NOTE — Progress Notes (Signed)
Patient ID: Donna Sweeney, female   DOB: 12/22/1945, 68 y.o.   MRN: [id number]       HPI: Donna Sweeney, is a 68 y.o. female who presents for a three month cardiology evaluation.  Donna Sweeney is a 68 year old African American female who has a history of obesity, long-standing history of hypertension, congestive heart failure and while she lived in [place] was enrolled in a heart failure clinic. She has a > 30 year history of diabetes mellitus,  a history of ventral hernia, as well as severe obstructive sleep apnea for which he utilizes CPAP therapy. An echo Doppler study in February 2014 showed moderate LVH with an ejection fraction at 60-65%,  grade 1 diastolic dysfunction and his abnormal tissue Doppler E/e' ratio greater than 20 suggesting markedly elevated LV filling pressures. She did have severe LA dilatation by volume assessment and  very mild pulmonary hypertension.  She has renal insufficiency and this has progressed from grade 3 to grade 4. Her GFR had reduced down to a nadir of 16 and is now approximately 22. She continues to have some difficulty with hypertension and Dr. Moshe Cipro had recently increased her Diovan from 160-320 mg daily. The patient tells me when she goes to pain management at times her blood pressure has ranged from 409-811 systolically. She does note shortness of breath with walking. She does note some intermittent leg swelling. Apparently her renal function worsened with further increase of her diuretic and apparently this has been reduced back to 80 mg once a day from twice a day. She denies exertional angina symptoms. There is exertional shortness of breath. When I last saw her several months ago, and further titrated her carvedilol to 37.5 mg twice a day. She is followed up with Dr. Moshe Cipro for her renal insufficiency. She presents now for followup cardiology evaluation.  [name] does use her CPAP 100% of the time with reference to her severe  obstructive sleep apnea.  Since I last saw her, she ruptured her Achilles tendon nor 07/01/2013. She also underwent back pain injections into her lumbar spine. She has noticed occasional palpitations and some vague chest soreness. Her shortness of breath has improved.   Past Medical History  Diagnosis Date  . Hypertension   . CHF (congestive heart failure)   . Constipation   . Hypothyroidism   . GERD (gastroesophageal reflux disease)   . Hyperlipidemia   . Hx of seasonal allergies   . Fibromyalgia   . Arthritis   . Angina at rest   . Asthma   . Diabetes mellitus   . History of cardiac cath [phone]    non obstructive CAD (done in [place]) EF 55%  . Herniated cervical disc   . Sciatica   . Diverticulitis   . Ulcerative colitis   . CAD (coronary artery disease)     non obstructive disease  . Biventricular heart failure   . Echocardiogram abnormal 10/17/12    EF 08-65%, grade I diastolic dysfunction, trivial MR, severely dilated LA  . Myocardial infarction   . Peripheral vascular disease   . Anemia   . Chronic kidney disease     stage 4 chronic kidney disease, lasr saw dr Debroah Baller  . Achilles tendon rupture 07-01-2013    right, wears boot to knee  . Bruises easily     both legs  . OSA (obstructive sleep apnea)     cpap setting of 9    Past Surgical History  Procedure Laterality Date  .  Tonsillectomy    . Dilation and curettage of uterus    . Cholecystectomy    . Oophorectomy    . Cystectomy    . Knee arthroscopy    . Replacement total knee      right  . Foot surgery      right  . Bunionectomy      right  . Hammer toe surgery      right  . Mole removal      right 5th toe  . Foot surgery      joint removed from great toe and pin inserted  . Cardiac catheterization  2013, 2005, 1999    non obstructive CAD  . Abdominal hysterectomy    . Revision total knee arthroplasty Right 2009    had hematoma afterwards    Allergies  Allergen Reactions  . Ivp  Dye [Iodinated Diagnostic Agents] Shortness Of Breath, Itching and Swelling  . Raspberry Shortness Of Breath, Itching and Swelling    Current Outpatient Prescriptions  Medication Sig Dispense Refill  . albuterol (PROVENTIL HFA;VENTOLIN HFA) 108 (90 BASE) MCG/ACT inhaler Inhale 2 puffs into the lungs 2 (two) times daily. For shortness of breath      . aspirin 81 MG chewable tablet Chew 81 mg by mouth every morning.       . calcium-vitamin D (OSCAL WITH D) 500-200 MG-UNIT per tablet Take 1 tablet by mouth 2 (two) times daily.      . cholecalciferol (VITAMIN D) 1000 UNITS tablet Take 1,000 Units by mouth daily.      . colchicine 0.6 MG tablet Take 0.6 mg by mouth daily.      . cycloSPORINE (RESTASIS) 0.05 % ophthalmic emulsion Place 1 drop into both eyes 2 (two) times daily.      . DULoxetine (CYMBALTA) 60 MG capsule Take 60 mg by mouth every evening.       . fenofibrate (TRICOR) 145 MG tablet Take 145 mg by mouth every morning.       . fish oil-omega-3 fatty acids 1000 MG capsule Take 1 g by mouth 3 (three) times daily.      . fluticasone (FLONASE) 50 MCG/ACT nasal spray Place 2 sprays into the nose daily.      . furosemide (LASIX) 80 MG tablet Take 40-80 mg by mouth 2 (two) times daily. Take one in the morning and half at lunch time      . gabapentin (NEURONTIN) 300 MG capsule Take 300 mg by mouth 3 (three) times daily.      . hydrALAZINE (APRESOLINE) 50 MG tablet Take 50 mg by mouth 3 (three) times daily.      . insulin glargine (LANTUS) 100 UNIT/ML injection Inject 40 Units into the skin at bedtime.       Marland Kitchen lactulose, encephalopathy, (GENERLAC) 10 GM/15ML SOLN Take 10 g by mouth 2 (two) times daily.       Marland Kitchen levothyroxine (SYNTHROID, LEVOTHROID) 100 MCG tablet Take 100 mcg by mouth daily before breakfast.       . nitroGLYCERIN (NITROSTAT) 0.4 MG SL tablet Place 0.4 mg under the tongue every 5 (five) minutes as needed for chest pain. For chest pain      . Oxycodone HCl 10 MG TABS Take 10 mg by  mouth 2 (two) times daily.      Marland Kitchen oxyCODONE-acetaminophen (PERCOCET/ROXICET) 5-325 MG per tablet Take 1 tablet by mouth daily. For pain      . pantoprazole (PROTONIX) 40 MG tablet Take 20 mg  by mouth 2 (two) times daily.       . potassium chloride SA (K-DUR,KLOR-CON) 20 MEQ tablet Take 20 mEq by mouth daily.      . simvastatin (ZOCOR) 40 MG tablet Take 40 mg by mouth every evening.      . traZODone (DESYREL) 50 MG tablet Take 50 mg by mouth at bedtime.       . valsartan (DIOVAN) 320 MG tablet Take 320 mg by mouth at bedtime.      Marland Kitchen VICTOZA 18 MG/3ML SOPN Inject 1.2 Units into the skin daily.      . [DISCONTINUED] carvedilol (COREG) 25 MG tablet Take 37.5 mg by mouth 2 (two) times daily with a meal.       No current facility-administered medications for this visit.    History   Social History  . Marital Status: Divorced    Spouse Name: N/A    Number of Children: N/A  . Years of Education: N/A   Occupational History  . Not on file.   Social History Main Topics  . Smoking status: Never Smoker   . Smokeless tobacco: Never Used  . Alcohol Use: No  . Drug Use: No  . Sexual Activity: No   Other Topics Concern  . Not on file   Social History Narrative  . No narrative on file    Family History  Problem Relation Age of Onset  . Diabetes type II Mother   . Hypertension Mother   . Hyperlipidemia Mother   . Kidney disease Mother   . Fibroids Mother   . Hypertension Sister   . Hypertension Brother   . Hypertension Sister   . Hypertension Sister     ROS is negative for fevers, chills or night sweats. She does admit to weight gain. She does admit to some occasional swelling. She denies skin rash. She denies changes in her vision. She does note occasional palpitations. She denies presyncope or syncope. She admits to a sharp intermittent pleuritic-like chest ache. She denied wheezing. She does note more shortness of breath with lying flat than sitting up. The denies abdominal pain.  She does have a ventral hernia. She denies GU bleeding. She denies change in bowel or bladder habits. She does note some occasional ankle swelling. She does have arthritic issues with carpal tunnel bilaterally and has been wearing braces on her wrists and ankles. She ruptured her Achilles tendon. She is status post injections to the lumbar spine for back pain. He denies neurologic symptoms.  She is diabetic.  She admits to 100% compliance with her CPAP therapy. She denies excessive daytime sleepiness or wears a breakthrough snoring. Other comprehensive 14 point system review is negative.  PE BP 134/78  Pulse 69  Ht 5\' 8"  (1.727 m)  Wt 274 lb 3.2 oz (124.376 kg)  BMI 41.70 kg/m2   repeat blood pressure 150/86 General: Alert, oriented, no distress.  Skin: normal turgor, no rashes HEENT: Normocephalic, atraumatic. Pupils round and reactive; sclera anicteric;no lid lag.  Nose without nasal septal hypertrophy Mouth/Parynx benign; Mallinpatti scale 3/4 Neck: No JVD, no carotid bruits with normal carotid upstrokes. Chest wall: No tenderness to palpation to Lungs: clear to ausculatation and percussion; no wheezing or rales Heart: RRR, s1 s2 normal 1/6 systolic murmur; no S3 gallop. No rub. Abdomen: Moderate central adiposity with question diastases recti versus a hernia.soft, nontender; no hepatosplenomehaly, BS+; abdominal aorta nontender and not dilated by palpation. Pulses 2+ Extremities:  Mild edema ; no clubbing cyanosis, Homan's  sign negative  Neurologic: grossly nonfocal; cranial nerves grossly normal. Psychological: Normal affect and mood  ECG (independently read by me) sinus rhythm with premature atrial complexes. LVH by voltage criteria.  Prior ECG of 05/28/2013: Sinus rhythm at 77 beats per minute with sinus arrhythmia and occasional PVCs.  LABS:  BMET    Component Value Date/Time   NA 138 07/01/2013 1318     Hepatic Function Panel  No results found for this basename: prot,   albumin,  ast,  alt,  alkphos,  bilitot,  bilidir,  ibili     CBC No results found for this basename: wbc,  rbc,  hgb,  hct,  plt,  mcv,  mch,  mchc,  rdw,  neutrabs,  lymphsabs,  monoabs,  eosabs,  basosabs     BNP No results found for this basename: probnp    Lipid Panel  No results found for this basename: chol,  trig,  hdl,  cholhdl,  vldl,  ldlcalc     RADIOLOGY: No results found.    ASSESSMENT AND PLAN: [name] is a 68 year old African American female with a history of morbid obesity with a BMI of 41.7, hypertension and documented normal systolic function with grade 1 diastolic dysfunction with elevated filling pressures by tissue Doppler.  She does have significant renal insufficiency which has progressed to class IV. Blood pressure today is significantly improved from her last evaluation. I am recommending further titration of her carvedilol to 50 mg in the morning and 37.5 mg in the evening. If she continues to note palpitations she we'll then further titrate this to 50 mg twice a day. Presently, she does not have any signs of heart failure. Her shortness of breath has improved. She is on simvastatin and fenofibrate for hyperlipidemia. There is a history of hypothyroidism and this has been controlled on her current dose of Synthroid 100 mcg. She is diabetic. There is a history of gout but she denies any further episodes but he is taking colchicine daily. She is using her CPAP therapy with 100% compliance and is without breakthrough snoring or residual daytime sleepiness. She will see Dr. Moshe Cipro in followup of her renal function and I will ask that her laboratory be sent to my office for my review. I will see her in 6 months for cardiology reevaluation or sooner if problems arise.   Troy Sine, MD, [facility]  09/03/2013 11:14 AM

## 2013-09-03 NOTE — Patient Instructions (Signed)
Your physician recommends that you schedule a follow-up appointment in: 6 MONTHS.  Your physician has recommended you make the following change in your medication: TAKE THE CARVEDILOL 25 MG 2 TABLETS IN THE MORNING. TAKE 1 & 1/2 TABLET IN THE PM. IF YOU NOTICE HEART SKIPPING, YOU CAN INCREASE TO 2 IN THE PM.

## 2013-09-04 ENCOUNTER — Ambulatory Visit (HOSPITAL_COMMUNITY)
Admission: RE | Admit: 2013-09-04 | Discharge: 2013-09-04 | Disposition: A | Discharge status: Home / Self Care | Payer: Medicare Other | Source: Ambulatory Visit | Attending: Gastroenterology | Admitting: Gastroenterology

## 2013-09-04 ENCOUNTER — Encounter (HOSPITAL_COMMUNITY): Payer: Medicare Other | Admitting: Registered Nurse

## 2013-09-04 ENCOUNTER — Ambulatory Visit (HOSPITAL_COMMUNITY): Payer: Medicare Other | Admitting: Registered Nurse

## 2013-09-04 ENCOUNTER — Encounter (HOSPITAL_COMMUNITY): Payer: Self-pay

## 2013-09-04 ENCOUNTER — Encounter (HOSPITAL_COMMUNITY)
Admission: RE | Disposition: A | Discharge status: Home / Self Care | Payer: Self-pay | Source: Ambulatory Visit | Attending: Gastroenterology

## 2013-09-04 DIAGNOSIS — I252 Old myocardial infarction: Secondary | ICD-10-CM | POA: Insufficient documentation

## 2013-09-04 DIAGNOSIS — I2510 Atherosclerotic heart disease of native coronary artery without angina pectoris: Secondary | ICD-10-CM | POA: Diagnosis not present

## 2013-09-04 DIAGNOSIS — E119 Type 2 diabetes mellitus without complications: Secondary | ICD-10-CM | POA: Insufficient documentation

## 2013-09-04 DIAGNOSIS — I509 Heart failure, unspecified: Secondary | ICD-10-CM | POA: Diagnosis not present

## 2013-09-04 DIAGNOSIS — J45909 Unspecified asthma, uncomplicated: Secondary | ICD-10-CM | POA: Insufficient documentation

## 2013-09-04 DIAGNOSIS — D126 Benign neoplasm of colon, unspecified: Secondary | ICD-10-CM | POA: Insufficient documentation

## 2013-09-04 DIAGNOSIS — I129 Hypertensive chronic kidney disease with stage 1 through stage 4 chronic kidney disease, or unspecified chronic kidney disease: Secondary | ICD-10-CM | POA: Insufficient documentation

## 2013-09-04 DIAGNOSIS — K5190 Ulcerative colitis, unspecified, without complications: Secondary | ICD-10-CM | POA: Diagnosis not present

## 2013-09-04 DIAGNOSIS — N189 Chronic kidney disease, unspecified: Secondary | ICD-10-CM | POA: Diagnosis not present

## 2013-09-04 DIAGNOSIS — R109 Unspecified abdominal pain: Secondary | ICD-10-CM | POA: Insufficient documentation

## 2013-09-04 DIAGNOSIS — Z794 Long term (current) use of insulin: Secondary | ICD-10-CM | POA: Insufficient documentation

## 2013-09-04 DIAGNOSIS — R1084 Generalized abdominal pain: Secondary | ICD-10-CM | POA: Diagnosis present

## 2013-09-04 DIAGNOSIS — K219 Gastro-esophageal reflux disease without esophagitis: Secondary | ICD-10-CM | POA: Insufficient documentation

## 2013-09-04 DIAGNOSIS — G4730 Sleep apnea, unspecified: Secondary | ICD-10-CM | POA: Diagnosis not present

## 2013-09-04 DIAGNOSIS — K5730 Diverticulosis of large intestine without perforation or abscess without bleeding: Secondary | ICD-10-CM | POA: Insufficient documentation

## 2013-09-04 DIAGNOSIS — K648 Other hemorrhoids: Secondary | ICD-10-CM | POA: Insufficient documentation

## 2013-09-04 HISTORY — PX: ESOPHAGOGASTRODUODENOSCOPY (EGD) WITH PROPOFOL: SHX5813

## 2013-09-04 HISTORY — DX: Spontaneous ecchymoses: R23.3

## 2013-09-04 HISTORY — PX: COLONOSCOPY WITH PROPOFOL: SHX5780

## 2013-09-04 HISTORY — DX: Other skin changes: R23.8

## 2013-09-04 HISTORY — DX: Strain of unspecified achilles tendon, initial encounter: S86.019A

## 2013-09-04 LAB — BASIC METABOLIC PANEL
BUN: 43 mg/dL — AB (ref 6–23)
CALCIUM: 9.1 mg/dL (ref 8.4–10.5)
CO2: 30 mEq/L (ref 19–32)
Chloride: 97 mEq/L (ref 96–112)
Creatinine, Ser: 2.26 mg/dL — ABNORMAL HIGH (ref 0.50–1.10)
GFR calc Af Amer: 25 mL/min — ABNORMAL LOW (ref >90)
GFR, EST NON AFRICAN AMERICAN: 21 mL/min — AB (ref >90)
GLUCOSE: 74 mg/dL (ref 70–99)
Potassium: 4.4 mEq/L (ref 3.7–5.3)
Sodium: 137 mEq/L (ref 137–147)

## 2013-09-04 LAB — GLUCOSE, CAPILLARY
GLUCOSE-CAPILLARY: 67 mg/dL — AB (ref 70–99)
GLUCOSE-CAPILLARY: 58 mg/dL — AB (ref 70–99)
Glucose-Capillary: 67 mg/dL — ABNORMAL LOW (ref 70–99)
Glucose-Capillary: 96 mg/dL (ref 70–99)
Glucose-Capillary: 111 mg/dL — ABNORMAL HIGH (ref 70–99)
Glucose-Capillary: 73 mg/dL (ref 70–99)

## 2013-09-04 LAB — HEMOGLOBIN AND HEMATOCRIT, BLOOD
HCT: 28.3 % — ABNORMAL LOW (ref 36.0–46.0)
Hemoglobin: 9.5 g/dL — ABNORMAL LOW (ref 12.0–15.0)

## 2013-09-04 SURGERY — ESOPHAGOGASTRODUODENOSCOPY (EGD) WITH PROPOFOL
Anesthesia: Monitor Anesthesia Care

## 2013-09-04 MED ORDER — DEXTROSE 50 % IV SOLN
25.0000 g | Freq: Once | INTRAVENOUS | Status: AC
  Administered 2013-09-04: 6.25 g via INTRAVENOUS
  Filled 2013-09-04: qty 50

## 2013-09-04 MED ORDER — SODIUM CHLORIDE 0.9 % IV SOLN
INTRAVENOUS | Status: DC
  Administered 2013-09-04: 10:00:00 via INTRAVENOUS

## 2013-09-04 MED ORDER — DEXTROSE 50 % IV SOLN
25.0000 mL | Freq: Once | INTRAVENOUS | Status: AC
  Administered 2013-09-04: 25 mL via INTRAVENOUS
  Filled 2013-09-04: qty 50

## 2013-09-04 MED ORDER — LACTATED RINGERS IV SOLN: INTRAVENOUS | Status: DC

## 2013-09-04 MED ORDER — LIDOCAINE HCL (CARDIAC) 20 MG/ML IV SOLN
INTRAVENOUS | Status: AC
  Filled 2013-09-04: qty 5

## 2013-09-04 MED ORDER — KETAMINE HCL 10 MG/ML IJ SOLN
INTRAMUSCULAR | Status: DC | PRN
  Administered 2013-09-04 (×3): 10 mg via INTRAVENOUS

## 2013-09-04 MED ORDER — MIDAZOLAM HCL 2 MG/2ML IJ SOLN
INTRAMUSCULAR | Status: AC
  Filled 2013-09-04: qty 2

## 2013-09-04 MED ORDER — DEXTROSE 50 % IV SOLN
6.2500 g | Freq: Once | INTRAVENOUS | Status: AC
  Administered 2013-09-04: 6.25 g via INTRAVENOUS

## 2013-09-04 MED ORDER — PROPOFOL 10 MG/ML IV BOLUS
INTRAVENOUS | Status: AC
  Filled 2013-09-04: qty 20

## 2013-09-04 MED ORDER — LIDOCAINE HCL (CARDIAC) 20 MG/ML IV SOLN
INTRAVENOUS | Status: DC | PRN
  Administered 2013-09-04: 100 mg via INTRAVENOUS

## 2013-09-04 MED ORDER — PROPOFOL INFUSION 10 MG/ML OPTIME
INTRAVENOUS | Status: DC | PRN
  Administered 2013-09-04: 100 ug/kg/min via INTRAVENOUS

## 2013-09-04 MED ORDER — MIDAZOLAM HCL 5 MG/5ML IJ SOLN
INTRAMUSCULAR | Status: DC | PRN
  Administered 2013-09-04: 1 mg via INTRAVENOUS

## 2013-09-04 MED ORDER — FENTANYL CITRATE 0.05 MG/ML IJ SOLN: 25.0000 ug | INTRAMUSCULAR | Status: DC | PRN

## 2013-09-04 MED ORDER — BUTAMBEN-TETRACAINE-BENZOCAINE 2-2-14 % EX AERO
INHALATION_SPRAY | CUTANEOUS | Status: DC | PRN
  Administered 2013-09-04: 2 via TOPICAL

## 2013-09-04 MED ORDER — INSULIN ASPART 100 UNIT/ML ~~LOC~~ SOLN
0.0000 [IU] | SUBCUTANEOUS | Status: DC
  Filled 2013-09-04: qty 0.15

## 2013-09-04 SURGICAL SUPPLY — 24 items

## 2013-09-04 NOTE — Transfer of Care (Signed)
Immediate Anesthesia Transfer of Care Note  Patient: Donna Sweeney  Procedure(s) Performed: Procedure(s): ESOPHAGOGASTRODUODENOSCOPY (EGD) WITH PROPOFOL (N/A) COLONOSCOPY WITH PROPOFOL (N/A)  Patient Location: PACU and Endoscopy Unit  Anesthesia Type:MAC  Level of Consciousness: awake, alert , oriented and patient cooperative  Airway & Oxygen Therapy: Patient Spontanous Breathing and Patient connected to nasal cannula oxygen  Post-op Assessment: Report given to PACU RN, Post -op Vital signs reviewed and stable and Patient moving all extremities  Post vital signs: Reviewed and stable  Complications: No apparent anesthesia complications

## 2013-09-04 NOTE — Op Note (Signed)
[facility] [place], [address]   ENDOSCOPY PROCEDURE REPORT  PATIENT: Donna, Sweeney.  MR#: [id number] BIRTHDATE: 03-17-1946 , 67  yrs. old GENDER: Female  ENDOSCOPIST: Wilford Corner, MD REFERRED GH:WEXHB Baird Cancer, M.D.  PROCEDURE DATE:  09/04/2013 PROCEDURE:   EGD w/ biopsy ASA CLASS:   Class IV INDICATIONS:abdominal pain. MEDICATIONS: See Anesthesia Report.  TOPICAL ANESTHETIC:  DESCRIPTION OF PROCEDURE:   After the risks benefits and alternatives of the procedure were thoroughly explained, informed consent was obtained.  The Pentax Gastroscope [id number]  endoscope was introduced through the mouth and advanced to the second portion of the duodenum , limited by Without limitations.   The instrument was slowly withdrawn as the mucosa was fully examined.     FINDINGS: The endoscope was inserted into the oropharynx and esophagus was intubated.  The gastroesophageal junction was noted to be 40 cm from the incisors.  Endoscope was advanced into the stomach, which revealed a nodular mucosal fold that was biopsied. The endoscope was advanced to the duodenal bulb and second portion of duodenum which were unremarkable.  The endoscope was withdrawn back into the stomach and retroflexion was unremarkable.  COMPLICATIONS: None  ENDOSCOPIC IMPRESSION:     Nodular antral fold - s/p biopsies  RECOMMENDATIONS: F/U on path   REPEAT EXAM: N/A  _______________________________ Wilford Corner, MD eSigned:  Wilford Corner, MD 09/04/2013 12:09 PM    ZJ:IRCVE Baird Cancer, MD  PATIENT NAME:  Donna, Sweeney. MR#: [id number]

## 2013-09-04 NOTE — Discharge Instructions (Addendum)
Gastrointestinal Endoscopy Care After Refer to this sheet in the next few weeks. These instructions provide you with information on caring for yourself after your procedure. Your caregiver may also give you more specific instructions. Your treatment has been planned according to current medical practices, but problems sometimes occur. Call your caregiver if you have any problems or questions after your procedure. HOME CARE INSTRUCTIONS  If you were given medicine to help you relax (sedative), do not drive, operate machinery, or sign important documents for 24 hours.  Avoid alcohol and hot or warm beverages for the first 24 hours after the procedure.  Only take over-the-counter or prescription medicines for pain, discomfort, or fever as directed by your caregiver. You may resume taking your normal medicines unless your caregiver tells you otherwise. Ask your caregiver when you may resume taking medicines that may cause bleeding, such as aspirin, clopidogrel, or warfarin.  You may return to your normal diet and activities on the day after your procedure, or as directed by your caregiver. Walking may help to reduce any bloated feeling in your abdomen.  Drink enough fluids to keep your urine clear or pale yellow.  You may gargle with salt water if you have a sore throat. SEEK IMMEDIATE MEDICAL CARE IF:  You have severe nausea or vomiting.  You have severe abdominal pain, abdominal cramps that last longer than 6 hours, or abdominal swelling (distention).  You have severe shoulder or back pain.  You have trouble swallowing.  You have shortness of breath, your breathing is shallow, or you are breathing faster than normal.  You have a fever or a rapid heartbeat.  You vomit blood or material that looks like coffee grounds.  You have bloody, black, or tarry stools. MAKE SURE YOU:  Understand these instructions.  Will watch your condition.  Will get help right away if you are not doing  well or get worse. Document Released: 03/21/2004 Document Revised: 02/06/2012 Document Reviewed: 11/07/2011 [facility] Patient Information 2014 [place], [place].   No aspirin products for 2 weeks.

## 2013-09-04 NOTE — Op Note (Signed)
[facility] [place], [id number]   COLONOSCOPY PROCEDURE REPORT  PATIENT: Donna Sweeney, Donna Sweeney.  MR#: [id number] BIRTHDATE: 05/27/1946 , 67  yrs. old GENDER: Female ENDOSCOPIST: Wilford Corner, MD REFERRED BY: PROCEDURE DATE:  09/04/2013 PROCEDURE:   Colonoscopy with snare polypectomy ASA CLASS:   Class IV INDICATIONS:History of Ulcerative Colitis; Abdominal pain MEDICATIONS: See Anesthesia Report.  DESCRIPTION OF PROCEDURE:   After the risks benefits and alternatives of the procedure were thoroughly explained, informed consent was obtained.  The Pentax Ped Colon [id number]  endoscope was introduced through the anus and advanced to the cecum, which was identified by both the appendix and ileocecal valve , limited by No adverse events experienced.   The quality of the prep was fair. . The instrument was then slowly withdrawn as the colon was fully examined.     FINDINGS:  Rectal exam unremarkable.  Pediatric colonoscope inserted into the colon and advanced to the cecum, where the appendiceal orifice and ileocecal valve were identified. In order to reach the cecum repeated loop reduction and abdominal pressure was necessary. On careful withdrawal of the colonoscope a 3 mm flat ICV polyp was biopsied. A 1 cm sessile ascending colon polyp was removed with snare cautery. A 2 cm pedunculated and sessile polyp was removed in the descending colon with snare cautery.  One small diverticuli was seen in the ascending colon.  Retroflexion unsuccessful due to spasms and small rectal vault but small internal hemorrhoids seen on slow withdrawal from the rectum.  COMPLICATIONS: None  IMPRESSION:     1. Colon polyps X 3 - see above 2. Diverticulosis 3. Internal hemorrhoids  RECOMMENDATIONS: F/U on path; No aspirin products for 2 weeks    ______________________________ eSigned:  Wilford Corner, MD 09/04/2013 12:17 PM   SW:NIOEV Baird Cancer, MD

## 2013-09-04 NOTE — Interval H&P Note (Signed)
History and Physical Interval Note:  09/04/2013 10:27 AM  Donna Sweeney  has presented today for surgery, with the diagnosis of ulcerative colitis/abd.pain  The various methods of treatment have been discussed with the patient and family. After consideration of risks, benefits and other options for treatment, the patient has consented to  Procedure(s): ESOPHAGOGASTRODUODENOSCOPY (EGD) WITH PROPOFOL (N/A) COLONOSCOPY WITH PROPOFOL (N/A) as a surgical intervention .  The patient's history has been reviewed, patient examined, no change in status, stable for surgery.  I have reviewed the patient's chart and labs.  Questions were answered to the patient's satisfaction.     [place] C.

## 2013-09-04 NOTE — H&P (Signed)
  Date of Initial H&P: 08/29/13  History reviewed, patient examined, no change in status, stable for surgery.

## 2013-09-04 NOTE — Anesthesia Postprocedure Evaluation (Signed)
  Anesthesia Post-op Note  Patient: Donna Sweeney  Procedure(s) Performed: Procedure(s) (LRB): ESOPHAGOGASTRODUODENOSCOPY (EGD) WITH PROPOFOL (N/A) COLONOSCOPY WITH PROPOFOL (N/A)  Patient Location: PACU  Anesthesia Type: MAC  Level of Consciousness: awake and alert   Airway and Oxygen Therapy: Patient Spontanous Breathing  Post-op Pain: mild  Post-op Assessment: Post-op Vital signs reviewed, Patient's Cardiovascular Status Stable, Respiratory Function Stable, Patent Airway and No signs of Nausea or vomiting  Last Vitals:  Filed Vitals:   09/04/13 1205  BP: 147/83  Pulse:   Temp: 36.5 C  Resp: 21    Post-op Vital Signs: stable   Complications: No apparent anesthesia complications

## 2013-09-04 NOTE — Preoperative (Signed)
Beta Blockers   Reason not to administer Beta Blockers:Coreg 09-04-13 at 0700

## 2013-09-04 NOTE — Anesthesia Preprocedure Evaluation (Addendum)
Anesthesia Evaluation  Patient identified by MRN, date of birth, ID band Patient awake    Reviewed: Allergy & Precautions, H&P , NPO status , Patient's Chart, lab work & pertinent test results, reviewed documented beta blocker date and time   Airway Mallampati: III TM Distance: >3 FB Neck ROM: full    Dental  (+) Edentulous Upper and Dental Advisory Given   Pulmonary asthma , sleep apnea and Continuous Positive Airway Pressure Ventilation ,  breath sounds clear to auscultation  Pulmonary exam normal       Cardiovascular Exercise Tolerance: Poor hypertension, Pt. on home beta blockers and Pt. on medications + CAD, + Past MI and +CHF Rhythm:regular Rate:Normal  Non-obstructive CAD  EF 55%. Grade 1 diastolic dysfuction.  Biventricular heart failure   Neuro/Psych negative neurological ROS  negative psych ROS   GI/Hepatic negative GI ROS, Neg liver ROS, GERD-  Medicated and Controlled,  Endo/Other  diabetes, Well Controlled, Type 2, Insulin DependentHypothyroidism Morbid obesity  Renal/GU Renal diseaseStage 4 chronic renal disease  negative genitourinary   Musculoskeletal   Abdominal (+) + obese,   Peds  Hematology negative hematology ROS (+)   Anesthesia Other Findings   Reproductive/Obstetrics negative OB ROS                       Anesthesia Physical Anesthesia Plan  ASA: IV  Anesthesia Plan: MAC   Post-op Pain Management:    Induction:   Airway Management Planned: Simple Face Mask  Additional Equipment:   Intra-op Plan:   Post-operative Plan:   Informed Consent: I have reviewed the patients History and Physical, chart, labs and discussed the procedure including the risks, benefits and alternatives for the proposed anesthesia with the patient or authorized representative who has indicated his/her understanding and acceptance.   Dental Advisory Given  Plan Discussed with: CRNA and  Surgeon  Anesthesia Plan Comments:         Anesthesia Quick Evaluation

## 2013-09-05 ENCOUNTER — Encounter (HOSPITAL_COMMUNITY): Payer: Self-pay | Admitting: Gastroenterology

## 2013-09-27 DIAGNOSIS — R002 Palpitations: Secondary | ICD-10-CM | POA: Insufficient documentation

## 2013-10-24 ENCOUNTER — Telehealth: Payer: Self-pay | Admitting: Cardiovascular Disease

## 2013-10-24 MED ORDER — CARVEDILOL 25 MG PO TABS: ORAL_TABLET | ORAL | Status: DC

## 2013-10-24 NOTE — Telephone Encounter (Signed)
Returned call and pt verified x 2.  Pt informed message received.  Asked if she changed pharmacies b/c script is active.  Pt stated [facility] "took it off."  Pt needs script sent to [facility].  Informed will be sent.  Pt verbalized understanding and agreed w/ plan.  Refill(s) sent to pharmacy.

## 2013-10-24 NOTE — Telephone Encounter (Signed)
Need a new prescripyion for Carvedilol 25 mg #100l He changed her doseage the last time she was here. Please call to [phone]

## 2013-10-30 ENCOUNTER — Other Ambulatory Visit: Payer: Self-pay | Admitting: *Deleted

## 2013-10-30 DIAGNOSIS — Z01810 Encounter for preprocedural cardiovascular examination: Secondary | ICD-10-CM

## 2013-10-30 DIAGNOSIS — N184 Chronic kidney disease, stage 4 (severe): Secondary | ICD-10-CM

## 2013-11-20 ENCOUNTER — Other Ambulatory Visit: Payer: Self-pay | Admitting: Family Medicine

## 2013-11-20 ENCOUNTER — Encounter: Payer: Self-pay | Admitting: Surgery

## 2013-11-20 DIAGNOSIS — Z1231 Encounter for screening mammogram for malignant neoplasm of breast: Secondary | ICD-10-CM

## 2013-11-24 ENCOUNTER — Ambulatory Visit (INDEPENDENT_AMBULATORY_CARE_PROVIDER_SITE_OTHER)
Admission: RE | Admit: 2013-11-24 | Discharge: 2013-11-24 | Disposition: A | Discharge status: Home / Self Care | Payer: Medicare Other | Source: Ambulatory Visit | Attending: Surgery | Admitting: Surgery

## 2013-11-24 ENCOUNTER — Ambulatory Visit (HOSPITAL_COMMUNITY)
Admission: RE | Admit: 2013-11-24 | Discharge: 2013-11-24 | Disposition: A | Discharge status: Home / Self Care | Payer: Medicare Other | Source: Ambulatory Visit | Attending: Surgery | Admitting: Surgery

## 2013-11-24 ENCOUNTER — Encounter: Payer: Self-pay | Admitting: Surgery

## 2013-11-24 ENCOUNTER — Ambulatory Visit (INDEPENDENT_AMBULATORY_CARE_PROVIDER_SITE_OTHER): Payer: Medicare Other | Admitting: Surgery

## 2013-11-24 VITALS — BP 188/86 | HR 65 | Ht 68.0 in | Wt 275.0 lb

## 2013-11-24 DIAGNOSIS — Z01810 Encounter for preprocedural cardiovascular examination: Secondary | ICD-10-CM

## 2013-11-24 DIAGNOSIS — N186 End stage renal disease: Secondary | ICD-10-CM

## 2013-11-24 DIAGNOSIS — N184 Chronic kidney disease, stage 4 (severe): Secondary | ICD-10-CM

## 2013-11-24 NOTE — Progress Notes (Signed)
Patient name: Donna Sweeney MRN: [id number] DOB: 17-Sep-1945 Sex: female   Referred by: Dr. Moshe Cipro  Reason for referral:  Chief Complaint  Patient presents with  . New Evaluation    eval for HD access    HISTORY OF PRESENT ILLNESS: This is a very pleasant 68 year old female who is right-handed, referred for dialysis access.  Her renal failure secondary to diabetes and hypertension.  She also has a history of congestive heart failure.  An echo in 2014 showed moderate LVH with an ejection fraction of 60-65% she also had elevated left ventricular filling pressures.  She'll he had mild pulmonary hypertension.  The patient suffers from sleep apnea and wears a CPAP machine at night.  She is medically managed for hypercholesterolemia with a statin.   Past Medical History  Diagnosis Date  . Hypertension   . CHF (congestive heart failure)   . Constipation   . Hypothyroidism   . GERD (gastroesophageal reflux disease)   . Hyperlipidemia   . Hx of seasonal allergies   . Fibromyalgia   . Arthritis   . Angina at rest   . Asthma   . Diabetes mellitus   . History of cardiac cath [phone]    non obstructive CAD (done in [place]) EF 55%  . Herniated cervical disc   . Sciatica   . Diverticulitis   . Ulcerative colitis   . CAD (coronary artery disease)     non obstructive disease  . Biventricular heart failure   . Echocardiogram abnormal 10/17/12    EF 95-62%, grade I diastolic dysfunction, trivial MR, severely dilated LA  . Myocardial infarction   . Peripheral vascular disease   . Anemia   . Chronic kidney disease     stage 4 chronic kidney disease, lasr saw dr Debroah Baller  . Achilles tendon rupture 07-01-2013    right, wears boot to knee  . Bruises easily     both legs  . OSA (obstructive sleep apnea)     cpap setting of 9  . DVT (deep venous thrombosis)     Past Surgical History  Procedure Laterality Date  . Tonsillectomy    . Dilation and curettage of  uterus    . Cholecystectomy    . Oophorectomy    . Cystectomy    . Knee arthroscopy    . Replacement total knee      right  . Foot surgery      right  . Bunionectomy      right  . Hammer toe surgery      right  . Mole removal      right 5th toe  . Foot surgery      joint removed from great toe and pin inserted  . Cardiac catheterization  2013, 2005, 1999    non obstructive CAD  . Abdominal hysterectomy    . Revision total knee arthroplasty Right 2009    had hematoma afterwards  . Esophagogastroduodenoscopy (egd) with propofol N/A 09/04/2013    Procedure: ESOPHAGOGASTRODUODENOSCOPY (EGD) WITH PROPOFOL;  Surgeon: Lear Ng, MD;  Location: [facility];  Service: Endoscopy;  Laterality: N/A;  . Colonoscopy with propofol N/A 09/04/2013    Procedure: COLONOSCOPY WITH PROPOFOL;  Surgeon: Lear Ng, MD;  Location: [facility];  Service: Endoscopy;  Laterality: N/A;    History   Social History  . Marital Status: Divorced    Spouse Name: N/A    Number of Children: N/A  . Years of Education: N/A  Occupational History  . Not on file.   Social History Main Topics  . Smoking status: Never Smoker   . Smokeless tobacco: Never Used  . Alcohol Use: No  . Drug Use: No  . Sexual Activity: No   Other Topics Concern  . Not on file   Social History Narrative  . No narrative on file    Family History  Problem Relation Age of Onset  . Diabetes type II Mother   . Hypertension Mother   . Hyperlipidemia Mother   . Kidney disease Mother   . Fibroids Mother   . Diabetes Mother   . Heart disease Mother   . Varicose Veins Mother   . Peripheral vascular disease Mother   . Hypertension Sister   . Hypertension Brother   . Hypertension Sister   . Hypertension Sister     Allergies as of 11/24/2013 - Review Complete 11/24/2013  Allergen Reaction Noted  . Ivp dye [iodinated diagnostic agents] Shortness Of Breath, Itching, and Swelling 05/26/2012  . Raspberry  Shortness Of Breath, Itching, and Swelling 05/26/2012    Current Outpatient Prescriptions on File Prior to Visit  Medication Sig Dispense Refill  . albuterol (PROVENTIL HFA;VENTOLIN HFA) 108 (90 BASE) MCG/ACT inhaler Inhale 2 puffs into the lungs 2 (two) times daily. For shortness of breath      . aspirin 81 MG chewable tablet Chew 81 mg by mouth every morning.       . calcium-vitamin D (OSCAL WITH D) 500-200 MG-UNIT per tablet Take 1 tablet by mouth 2 (two) times daily.      . carvedilol (COREG) 25 MG tablet Take 2 tablets in the AM take 1 & 1/2 - in the PM  120 tablet  4  . cholecalciferol (VITAMIN D) 1000 UNITS tablet Take 1,000 Units by mouth daily.      . colchicine 0.6 MG tablet Take 0.6 mg by mouth daily.      . cycloSPORINE (RESTASIS) 0.05 % ophthalmic emulsion Place 1 drop into both eyes 2 (two) times daily.      . DULoxetine (CYMBALTA) 60 MG capsule Take 60 mg by mouth every evening.       . fenofibrate (TRICOR) 145 MG tablet Take 145 mg by mouth every morning.       . fish oil-omega-3 fatty acids 1000 MG capsule Take 1 g by mouth 3 (three) times daily.      . fluticasone (FLONASE) 50 MCG/ACT nasal spray Place 2 sprays into the nose daily.      . furosemide (LASIX) 80 MG tablet Take 40-80 mg by mouth 2 (two) times daily. Take one in the morning and half at lunch time      . gabapentin (NEURONTIN) 300 MG capsule Take 300 mg by mouth 3 (three) times daily.      . hydrALAZINE (APRESOLINE) 50 MG tablet Take 50 mg by mouth 3 (three) times daily.      . insulin glargine (LANTUS) 100 UNIT/ML injection Inject 40 Units into the skin at bedtime.       Marland Kitchen lactulose, encephalopathy, (GENERLAC) 10 GM/15ML SOLN Take 10 g by mouth 2 (two) times daily.       Marland Kitchen levothyroxine (SYNTHROID, LEVOTHROID) 100 MCG tablet Take 100 mcg by mouth daily before breakfast.       . nitroGLYCERIN (NITROSTAT) 0.4 MG SL tablet Place 0.4 mg under the tongue every 5 (five) minutes as needed for chest pain. For chest pain       .  Oxycodone HCl 10 MG TABS Take 10 mg by mouth 2 (two) times daily.      Marland Kitchen oxyCODONE-acetaminophen (PERCOCET/ROXICET) 5-325 MG per tablet Take 1 tablet by mouth daily. For pain      . pantoprazole (PROTONIX) 40 MG tablet Take 20 mg by mouth 2 (two) times daily.       . potassium chloride SA (K-DUR,KLOR-CON) 20 MEQ tablet Take 20 mEq by mouth daily.      . simvastatin (ZOCOR) 40 MG tablet Take 40 mg by mouth every evening.      . traZODone (DESYREL) 50 MG tablet Take 50 mg by mouth at bedtime.       . valsartan (DIOVAN) 320 MG tablet Take 320 mg by mouth at bedtime.      Marland Kitchen VICTOZA 18 MG/3ML SOPN Inject 1.2 Units into the skin daily.       No current facility-administered medications on file prior to visit.     REVIEW OF SYSTEMS: Cardiovascular: No chest pain, chest pressure, palpitations, orthopnea, or dyspnea on exertion. No claudication or rest pain,  No history of DVT or phlebitis. Pulmonary: No productive cough, asthma or wheezing. Neurologic: No weakness, paresthesias, aphasia, or amaurosis. No dizziness. Hematologic: No bleeding problems or clotting disorders. Musculoskeletal: No joint pain or joint swelling. Gastrointestinal: No blood in stool or hematemesis Genitourinary: No dysuria or hematuria. Psychiatric:: No history of major depression. Integumentary: No rashes or ulcers. Constitutional: No fever or chills.  PHYSICAL EXAMINATION: General: The patient appears their stated age.  Vital signs are BP 188/86  Pulse 65  Ht 5\' 8"  (1.727 m)  Wt 275 lb (124.739 kg)  BMI 41.82 kg/m2  SpO2 98% HEENT:  No gross abnormalities Pulmonary: Respirations are non-labored Abdomen: Soft and non-tender  Musculoskeletal: There are no major deformities.  Uses a walker. Neurologic: No focal weakness or paresthesias are detected, Skin: There are no ulcer or rashes noted. Psychiatric: The patient has normal affect. Cardiovascular: There is a regular rate and rhythm without significant  murmur appreciated.  Diagnostic Studies: Vein mapping was ordered and reviewed.  She has a marginal basilic vein below the antecubital crease.  Proximal to this and appears to be adequate.  The cephalic vein is not visualized in the left upper arm.   Assessment:  End-stage renal disease Plan: I discussed proceeding with a first stage vs. formal basilic vein transposition on the left.  I discussed the risks and benefits of the operation including the risk of steal, the risk of infection, and the need for potential future interventions.  I told the patient that I would evaluate her cephalic vein in the forearm to see if she would be a candidate for a radiocephalic fistula.  I also will make a determination during her operation as to whether or not she would be best treated with a two-stage basilic vein transposition, or having the entire procedure done in one setting.  This is been scheduled for Friday, April 17.     Eldridge Abrahams, M.D. [facility] Office: [phone] Pager:  [phone]

## 2013-11-25 ENCOUNTER — Other Ambulatory Visit: Payer: Self-pay | Admitting: *Deleted

## 2013-11-25 ENCOUNTER — Encounter (HOSPITAL_COMMUNITY): Payer: Self-pay | Admitting: Pharmacy Technician

## 2013-12-02 ENCOUNTER — Encounter (HOSPITAL_COMMUNITY): Payer: Self-pay

## 2013-12-02 ENCOUNTER — Ambulatory Visit (HOSPITAL_COMMUNITY)
Admission: RE | Admit: 2013-12-02 | Discharge: 2013-12-02 | Disposition: A | Discharge status: Home / Self Care | Payer: Medicare Other | Source: Ambulatory Visit | Attending: Surgery | Admitting: Surgery

## 2013-12-02 ENCOUNTER — Encounter (HOSPITAL_COMMUNITY)
Admission: RE | Admit: 2013-12-02 | Discharge: 2013-12-02 | Disposition: A | Discharge status: Home / Self Care | Payer: Medicare Other | Source: Ambulatory Visit | Attending: Surgery | Admitting: Surgery

## 2013-12-02 DIAGNOSIS — Z01818 Encounter for other preprocedural examination: Secondary | ICD-10-CM | POA: Insufficient documentation

## 2013-12-02 DIAGNOSIS — Z01812 Encounter for preprocedural laboratory examination: Secondary | ICD-10-CM | POA: Insufficient documentation

## 2013-12-02 HISTORY — DX: Cardiac murmur, unspecified: R01.1

## 2013-12-02 HISTORY — DX: Personal history of other diseases of the digestive system: Z87.19

## 2013-12-02 LAB — CBC
HCT: 33.8 % — ABNORMAL LOW (ref 36.0–46.0)
Hemoglobin: 11.4 g/dL — ABNORMAL LOW (ref 12.0–15.0)
MCH: 30.4 pg (ref 26.0–34.0)
MCHC: 33.7 g/dL (ref 30.0–36.0)
MCV: 90.1 fL (ref 78.0–100.0)
Platelets: 300 10*3/uL (ref 150–400)
RBC: 3.75 MIL/uL — ABNORMAL LOW (ref 3.87–5.11)
RDW: 14.6 % (ref 11.5–15.5)
WBC: 8.2 10*3/uL (ref 4.0–10.5)

## 2013-12-02 LAB — BASIC METABOLIC PANEL
BUN: 47 mg/dL — ABNORMAL HIGH (ref 6–23)
CALCIUM: 10 mg/dL (ref 8.4–10.5)
CO2: 26 mEq/L (ref 19–32)
CREATININE: 2.39 mg/dL — AB (ref 0.50–1.10)
Chloride: 99 mEq/L (ref 96–112)
GFR, EST AFRICAN AMERICAN: 23 mL/min — AB (ref >90)
GFR, EST NON AFRICAN AMERICAN: 20 mL/min — AB (ref >90)
Glucose, Bld: 78 mg/dL (ref 70–99)
Potassium: 3.7 mEq/L (ref 3.7–5.3)
Sodium: 142 mEq/L (ref 137–147)

## 2013-12-02 NOTE — Pre-Procedure Instructions (Signed)
Donna Sweeney  12/02/2013   Your procedure is scheduled on:  April 17 at 1054  Report to [place]  2 * 3 at 0730 AM.  Call this number if you have problems the morning of surgery: [phone]   Remember:   Do not eat food or drink liquids after midnight.    Take these medicines the morning of surgery with A SIP OF WATER: Albuterol  Inhaler if needed, Coreg( carvedilol), colchicine, Cymbalta (duloxetine), Neurontin (gabapentin), Hydralazine (Apresoline), Synthroid (Levothyroxine), pain pill if needed (Oxycodone and Percocet), Protonix (pantopazole)  Stop taking BC's, Goody's, Ibuprofen, Aspirin, Herbal medications, Fish Oil, and Aleve.   Do not wear jewelry, make-up or nail polish.  Do not wear lotions, powders, or perfumes. You may wear deodorant.  Do not shave 48 hours prior to surgery.   Do not bring valuables to the hospital.  [facility] is not responsible  for any belongings or valuables.               Contacts, dentures or bridgework may not be worn into surgery.  Leave suitcase in the car. After surgery it may be brought to your room.  For patients admitted to the hospital, discharge time is determined by your  treatment team.               Patients discharged the day of surgery will not be allowed to drive home.    Special Instructions: [place] - Preparing for Surgery  Before surgery, you can play an important role.  Because skin is not sterile, your skin needs to be as free of germs as possible.  You can reduce the number of germs on you skin by washing with CHG (chlorahexidine gluconate) soap before surgery.  CHG is an antiseptic cleaner which kills germs and bonds with the skin to continue killing germs even after washing.  Please DO NOT use if you have an allergy to CHG or antibacterial soaps.  If your skin becomes reddened/irritated stop using the CHG and inform your nurse when you arrive at Short Stay.  Do not shave (including legs  and underarms) for at least 48 hours prior to the first CHG shower.  You may shave your face.  Please follow these instructions carefully:   1.  Shower with CHG Soap the night before surgery and the  morning of Surgery.  2.  If you choose to wash your hair, wash your hair first as usual with your  normal shampoo.  3.  After you shampoo, rinse your hair and body thoroughly to remove the  Shampoo.  4.  Use CHG as you would any other liquid soap.  You can apply chg directly to the skin and wash gently with scrungie or a clean washcloth.  5.  Apply the CHG Soap to your body ONLY FROM THE NECK DOWN.     Do not use on open wounds or open sores.  Avoid contact with your eyes,       ears, mouth and genitals (private parts).  Wash genitals (private parts)    with your normal soap.  6.  Wash thoroughly, paying special attention to the area where your surgery  will be performed.  7.  Thoroughly rinse your body with warm water from the neck down.  8.  DO NOT shower/wash with your normal soap after using and rinsing off  the CHG Soap.  9.  Pat yourself dry with a clean towel.  10.  Wear clean pajamas.            11.  Place clean sheets on your bed the night of your first shower and do not  sleep with pets.  Day of Surgery  Do not apply any lotions/deoderants the morning of surgery.  Please wear clean clothes to the hospital/surgery center.     Please read over the following fact sheets that you were given: Pain Booklet, Coughing and Deep Breathing and Surgical Site Infection Prevention

## 2013-12-02 NOTE — Progress Notes (Signed)
PCP is Asencion Partridge. [place], [place] Cardiologist is Dr Shelva Majestic Echo noted in epic from 10-17-12 Request sent to Dr Evette Georges office for last card cath and last stress test. Denies having a recent  CXR. Chart to [facility] for review.

## 2013-12-03 ENCOUNTER — Encounter: Payer: Self-pay | Admitting: Cardiology

## 2013-12-03 ENCOUNTER — Ambulatory Visit (INDEPENDENT_AMBULATORY_CARE_PROVIDER_SITE_OTHER): Payer: Medicare Other | Admitting: Cardiology

## 2013-12-03 VITALS — BP 182/90 | HR 74 | Ht 68.0 in | Wt 266.0 lb

## 2013-12-03 DIAGNOSIS — N186 End stage renal disease: Secondary | ICD-10-CM

## 2013-12-03 DIAGNOSIS — E118 Type 2 diabetes mellitus with unspecified complications: Secondary | ICD-10-CM

## 2013-12-03 DIAGNOSIS — I119 Hypertensive heart disease without heart failure: Secondary | ICD-10-CM | POA: Insufficient documentation

## 2013-12-03 DIAGNOSIS — I2510 Atherosclerotic heart disease of native coronary artery without angina pectoris: Secondary | ICD-10-CM

## 2013-12-03 DIAGNOSIS — G4733 Obstructive sleep apnea (adult) (pediatric): Secondary | ICD-10-CM

## 2013-12-03 DIAGNOSIS — I10 Essential (primary) hypertension: Secondary | ICD-10-CM

## 2013-12-03 DIAGNOSIS — E039 Hypothyroidism, unspecified: Secondary | ICD-10-CM

## 2013-12-03 DIAGNOSIS — E669 Obesity, unspecified: Secondary | ICD-10-CM

## 2013-12-03 NOTE — Patient Instructions (Signed)
Your physician recommends that you schedule a follow-up appointment in: 6 months with Dr. Kelly. 

## 2013-12-03 NOTE — Assessment & Plan Note (Signed)
Repeat B/P 146/86

## 2013-12-03 NOTE — Progress Notes (Signed)
Anesthesia Chart Review:  Patient is a 68 year old female scheduled for left first stage basilic vein transposition versus total basilic vein transposition on 12/05/13 by Dr. Trula Slade.  History includes HTN, DM2, CHF, question of prior MI with reported non-obstructive CAD by cardiac caths done in [place] (1999, 2005, 2013), OSA with CPAP, hypercholesterolemia, nonsmoker, hypothyroidism, fibromyalgia, arthritis, asthma, CKD stage IV, GERD, hiatal hernia, ulcerative colitis, anemia, DVT. BMI is 40 consistent with morbid obesity. PCP is Cammie Sickle, FNP.  Nephrologist is Dr. Moshe Cipro. Cardiologist is Dr. Shelva Majestic, with visit today with Kerin Ransom, PA-C (see Epic).  Echo on 10/17/12 showed: - Left ventricle: The cavity size was normal. There was moderate concentric hypertrophy. Systolic function was normal. The estimated ejection fraction was in the range of 60% to 65%. Wall motion was normal; there were no regional wall motion abnormalities. Doppler parameters are consistent with abnormal left ventricular relaxation (grade 1 diastolic dysfunction). The E/e' ratio is >20, suggesting markedly elevated LV Filling pressure. - Aortic valve: Structurally normal valve. Transvalvular velocity was minimally increased. There was no stenosis. - Mitral valve: Calcified annulus, mostly posterior. Trivial regurgitation. Valve area by pressure half-time: 2.5cm^2. - Left atrium: Severely dilated. LA Volume/BSA 45 ml/m2. - Atrial septum: The atrial septum is thin and bowed to the right, suggesting high left atrial pressure. A septal defect cannot be excluded as there is color doppler evidence of a possible small left to right shunt. Consider TEE for further evaluation if clinically warranted. - Pulmonary arteries: PA peak pressure: 27mm Hg (S). - Inferior vena cava: The vessel was normal in size; the respirophasic diameter changes were in the normal range (= 50%); findings are consistent with normal central venous  pressure.  EKG on 09/03/13 showed SR with sinus arrhythmia, occasional PVCs.   According to [facility] notes (Dr. Jeneen Rinks; [place]) she had a nuclear stress test in February 2013 that showed "Rreversible anterior wall defect consistent with myocardium at risk." "Status post cardiac catheterization with noncritical coronary artery disease and systolic hypertension, June 2013."  Cardiac cath on 12/24/08 ([facility]) showed: LVEF 55%. The LV end-diastolic pressure was 12. The left main trunk was normal. The left anterior descending artery has mild luminal irregularities. The circumflex artery and its marginal branches are free of any severe disease. The right coronary artery is dominant and free of any severe disease.  Preoperative CXR and labs noted. She is for ISTAT on the day of surgery. If results are acceptable and otherwise no acute changes then I anticipate that she can proceed as planned.  [facility] Short Stay Center/Anesthesiology Phone [phone] 12/03/2013 5:53 PM

## 2013-12-03 NOTE — Assessment & Plan Note (Addendum)
Last echo Feb 2014

## 2013-12-03 NOTE — Assessment & Plan Note (Signed)
Scheduled for HD access 12/05/13

## 2013-12-03 NOTE — Progress Notes (Signed)
12/03/2013 Donna Sweeney   [birth year]  [id number]  Primary Physicia Dorena Dew, FNP Primary Cardiologist: Dr Claiborne Billings  HPI:  68 y/o female followed by Dr Claiborne Billings with a history of CHF. She had prior cardiac evaluation done in [place]. She established with Dr Claiborne Billings in Feb 2014. She had normal LVF with grade 1 diastolic dysfunction by echo Feb 2014. She has had several cath, the last one being in [place] in 2013. This showed mild irregularities. She has progressive renal insufficiency and appears to be headed for HD. She is scheduled for HD access placement 12/05/13. She is followed by Dr Rolan Lipa at [facility]. She admits she is ready to start HD. Her mother was a HD pt so she is quite familiar with this.    Current Outpatient Prescriptions  Medication Sig Dispense Refill  . albuterol (PROVENTIL HFA;VENTOLIN HFA) 108 (90 BASE) MCG/ACT inhaler Inhale 2 puffs into the lungs 2 (two) times daily. For shortness of breath      . aspirin 81 MG chewable tablet Chew 81 mg by mouth every morning.       . calcium-vitamin D (OSCAL WITH D) 500-200 MG-UNIT per tablet Take 1 tablet by mouth 2 (two) times daily.      . carvedilol (COREG) 25 MG tablet Take 2 tablets in the AM take 1 & 1/2 - in the PM  120 tablet  4  . cholecalciferol (VITAMIN D) 1000 UNITS tablet Take 1,000 Units by mouth daily.      . colchicine 0.6 MG tablet Take 0.6 mg by mouth daily.      . cycloSPORINE (RESTASIS) 0.05 % ophthalmic emulsion Place 1 drop into both eyes 2 (two) times daily.      . DULoxetine (CYMBALTA) 60 MG capsule Take 60 mg by mouth every evening.       Marland Kitchen EASY TOUCH PEN NEEDLES 31G X 8 MM MISC       . fish oil-omega-3 fatty acids 1000 MG capsule Take 1 g by mouth 3 (three) times daily.      . fluticasone (FLONASE) 50 MCG/ACT nasal spray Place 2 sprays into the nose daily.      . furosemide (LASIX) 80 MG tablet Take 40-80 mg by mouth 2 (two) times daily. Take one in the morning and half at lunch time      .  gabapentin (NEURONTIN) 300 MG capsule Take 300 mg by mouth 3 (three) times daily.      . hydrALAZINE (APRESOLINE) 50 MG tablet Take 50 mg by mouth 3 (three) times daily.      . insulin glargine (LANTUS) 100 UNIT/ML injection Inject 40 Units into the skin at bedtime.       Marland Kitchen lactulose, encephalopathy, (GENERLAC) 10 GM/15ML SOLN Take 10 g by mouth 2 (two) times daily.       Marland Kitchen levothyroxine (SYNTHROID, LEVOTHROID) 100 MCG tablet Take 100 mcg by mouth daily before breakfast.       . nitroGLYCERIN (NITROSTAT) 0.4 MG SL tablet Place 0.4 mg under the tongue every 5 (five) minutes as needed for chest pain. For chest pain      . Oxycodone HCl 10 MG TABS Take 10 mg by mouth 2 (two) times daily.      Marland Kitchen oxyCODONE-acetaminophen (PERCOCET/ROXICET) 5-325 MG per tablet Take 1 tablet by mouth daily. For pain      . pantoprazole (PROTONIX) 40 MG tablet Take 20 mg by mouth 2 (two) times daily.       Marland Kitchen  potassium chloride SA (K-DUR,KLOR-CON) 20 MEQ tablet Take 20 mEq by mouth daily.      . simvastatin (ZOCOR) 40 MG tablet Take 40 mg by mouth every evening.      . SYMBICORT 80-4.5 MCG/ACT inhaler Inhale 2 puffs into the lungs 2 (two) times daily.      Marland Kitchen tiZANidine (ZANAFLEX) 2 MG tablet Take 2 mg by mouth 3 (three) times daily with meals.      . traZODone (DESYREL) 50 MG tablet Take 50 mg by mouth at bedtime.       . valsartan (DIOVAN) 320 MG tablet Take 320 mg by mouth at bedtime.      Marland Kitchen VICTOZA 18 MG/3ML SOPN Inject 1.2 Units into the skin daily.       No current facility-administered medications for this visit.    Allergies  Allergen Reactions  . Ivp Dye [Iodinated Diagnostic Agents] Shortness Of Breath, Itching and Swelling  . Raspberry Shortness Of Breath, Itching and Swelling    History   Social History  . Marital Status: Divorced    Spouse Name: N/A    Number of Children: N/A  . Years of Education: N/A   Occupational History  . Not on file.   Social History Main Topics  . Smoking status: Never  Smoker   . Smokeless tobacco: Never Used  . Alcohol Use: No  . Drug Use: No  . Sexual Activity: No   Other Topics Concern  . Not on file   Social History Narrative  . No narrative on file     Review of Systems: General: negative for chills, fever, night sweats or weight changes.  Cardiovascular: negative for chest pain, orthopnea, palpitations, paroxysmal nocturnal dyspnea or shortness of breath Dermatological: negative for rash Respiratory: negative for cough or wheezing Urologic: negative for hematuria Abdominal: negative for nausea, vomiting, diarrhea, bright red blood per rectum, melena, or hematemesis Neurologic: negative for visual changes, syncope, or dizziness All other systems reviewed and are otherwise negative except as noted above.    Blood pressure 182/90, pulse 74, height 5\' 8"  (1.727 m), weight 266 lb (120.657 kg).  General appearance: alert, cooperative, no distress and morbidly obese Neck: no carotid bruit and no JVD Lungs: clear to auscultation bilaterally Heart: regular rate and rhythm Abdomen: obese Extremities: brace on her Rt ankle from Achilles tendon tear, bilat 1-2+ edema  EKG NSR  ASSESSMENT AND PLAN:   End stage renal disease Scheduled for HD access 12/05/13  Hypertension Repeat B/P 146/86  Hypertensive cardiovascular disease- LVH, grade 1 diastolic dysfunction Last echo Feb 2014  CAD- non obstructive CAD- last cath 2013 in [place] .  Diabetes mellitus type 2, controlled, with complications .  OSA (obstructive sleep apnea)- C-pap compliant .  Obesity- BMI 41 .   PLAN  From a cardiac standpoint she is stable. We discussed the advantages of HD in controlling her B/P and volume. I reassured her we will be available for any cardiac issues and that we may actually cut back on some of her medications in the future. She will follow up with Dr Claiborne Billings in 6 months.   [facility] 12/03/2013 1:30 PM

## 2013-12-04 MED ORDER — DEXTROSE 5 % IV SOLN
1.5000 g | INTRAVENOUS | Status: AC
  Administered 2013-12-05: 1.5 g via INTRAVENOUS
  Filled 2013-12-04: qty 1.5

## 2013-12-05 ENCOUNTER — Encounter (HOSPITAL_COMMUNITY): Payer: Medicare Other | Admitting: Vascular Surgery

## 2013-12-05 ENCOUNTER — Ambulatory Visit (HOSPITAL_COMMUNITY)
Admission: RE | Admit: 2013-12-05 | Discharge: 2013-12-05 | Disposition: A | Discharge status: Home / Self Care | Payer: Medicare Other | Source: Ambulatory Visit | Attending: Surgery | Admitting: Surgery

## 2013-12-05 ENCOUNTER — Other Ambulatory Visit: Payer: Self-pay | Admitting: *Deleted

## 2013-12-05 ENCOUNTER — Ambulatory Visit (HOSPITAL_COMMUNITY): Payer: Medicare Other | Admitting: Anesthesiology

## 2013-12-05 ENCOUNTER — Encounter (HOSPITAL_COMMUNITY)
Admission: RE | Disposition: A | Discharge status: Home / Self Care | Payer: Medicare Other | Source: Ambulatory Visit | Attending: Surgery

## 2013-12-05 ENCOUNTER — Encounter (HOSPITAL_COMMUNITY): Payer: Self-pay | Admitting: *Deleted

## 2013-12-05 DIAGNOSIS — K5190 Ulcerative colitis, unspecified, without complications: Secondary | ICD-10-CM | POA: Insufficient documentation

## 2013-12-05 DIAGNOSIS — E1129 Type 2 diabetes mellitus with other diabetic kidney complication: Secondary | ICD-10-CM | POA: Insufficient documentation

## 2013-12-05 DIAGNOSIS — R011 Cardiac murmur, unspecified: Secondary | ICD-10-CM | POA: Insufficient documentation

## 2013-12-05 DIAGNOSIS — K219 Gastro-esophageal reflux disease without esophagitis: Secondary | ICD-10-CM | POA: Insufficient documentation

## 2013-12-05 DIAGNOSIS — I120 Hypertensive chronic kidney disease with stage 5 chronic kidney disease or end stage renal disease: Secondary | ICD-10-CM | POA: Insufficient documentation

## 2013-12-05 DIAGNOSIS — J45909 Unspecified asthma, uncomplicated: Secondary | ICD-10-CM | POA: Insufficient documentation

## 2013-12-05 DIAGNOSIS — N186 End stage renal disease: Secondary | ICD-10-CM | POA: Insufficient documentation

## 2013-12-05 DIAGNOSIS — I252 Old myocardial infarction: Secondary | ICD-10-CM | POA: Insufficient documentation

## 2013-12-05 DIAGNOSIS — I509 Heart failure, unspecified: Secondary | ICD-10-CM | POA: Insufficient documentation

## 2013-12-05 DIAGNOSIS — Z4931 Encounter for adequacy testing for hemodialysis: Secondary | ICD-10-CM

## 2013-12-05 DIAGNOSIS — N184 Chronic kidney disease, stage 4 (severe): Secondary | ICD-10-CM

## 2013-12-05 DIAGNOSIS — G4733 Obstructive sleep apnea (adult) (pediatric): Secondary | ICD-10-CM | POA: Insufficient documentation

## 2013-12-05 DIAGNOSIS — E039 Hypothyroidism, unspecified: Secondary | ICD-10-CM | POA: Insufficient documentation

## 2013-12-05 DIAGNOSIS — I739 Peripheral vascular disease, unspecified: Secondary | ICD-10-CM | POA: Insufficient documentation

## 2013-12-05 DIAGNOSIS — IMO0001 Reserved for inherently not codable concepts without codable children: Secondary | ICD-10-CM | POA: Insufficient documentation

## 2013-12-05 DIAGNOSIS — E785 Hyperlipidemia, unspecified: Secondary | ICD-10-CM | POA: Insufficient documentation

## 2013-12-05 DIAGNOSIS — Z96659 Presence of unspecified artificial knee joint: Secondary | ICD-10-CM | POA: Insufficient documentation

## 2013-12-05 DIAGNOSIS — Z794 Long term (current) use of insulin: Secondary | ICD-10-CM | POA: Insufficient documentation

## 2013-12-05 DIAGNOSIS — K449 Diaphragmatic hernia without obstruction or gangrene: Secondary | ICD-10-CM | POA: Insufficient documentation

## 2013-12-05 DIAGNOSIS — I2510 Atherosclerotic heart disease of native coronary artery without angina pectoris: Secondary | ICD-10-CM | POA: Insufficient documentation

## 2013-12-05 DIAGNOSIS — D649 Anemia, unspecified: Secondary | ICD-10-CM | POA: Insufficient documentation

## 2013-12-05 DIAGNOSIS — Z86718 Personal history of other venous thrombosis and embolism: Secondary | ICD-10-CM | POA: Insufficient documentation

## 2013-12-05 HISTORY — PX: BASCILIC VEIN TRANSPOSITION: SHX5742

## 2013-12-05 LAB — GLUCOSE, CAPILLARY
Glucose-Capillary: 77 mg/dL (ref 70–99)
Glucose-Capillary: 77 mg/dL (ref 70–99)
Glucose-Capillary: 81 mg/dL (ref 70–99)

## 2013-12-05 LAB — POCT I-STAT 4, (NA,K, GLUC, HGB,HCT)
Glucose, Bld: 79 mg/dL (ref 70–99)
HCT: 31 % — ABNORMAL LOW (ref 36.0–46.0)
HEMOGLOBIN: 10.5 g/dL — AB (ref 12.0–15.0)
POTASSIUM: 3.5 meq/L — AB (ref 3.7–5.3)
SODIUM: 142 meq/L (ref 137–147)

## 2013-12-05 SURGERY — TRANSPOSITION, VEIN, BASILIC
Anesthesia: Monitor Anesthesia Care | Site: Arm Upper | Laterality: Left

## 2013-12-05 MED ORDER — SODIUM CHLORIDE 0.9 % IV SOLN: INTRAVENOUS | Status: DC

## 2013-12-05 MED ORDER — FENTANYL CITRATE 0.05 MG/ML IJ SOLN
INTRAMUSCULAR | Status: AC
  Filled 2013-12-05: qty 5

## 2013-12-05 MED ORDER — PROTAMINE SULFATE 10 MG/ML IV SOLN
INTRAVENOUS | Status: DC | PRN
  Administered 2013-12-05: 10 mg via INTRAVENOUS
  Administered 2013-12-05: 15 mg via INTRAVENOUS

## 2013-12-05 MED ORDER — MIDAZOLAM HCL 2 MG/2ML IJ SOLN
INTRAMUSCULAR | Status: DC | PRN
  Administered 2013-12-05: 2 mg via INTRAVENOUS

## 2013-12-05 MED ORDER — 0.9 % SODIUM CHLORIDE (POUR BTL) OPTIME
TOPICAL | Status: DC | PRN
  Administered 2013-12-05: 1000 mL

## 2013-12-05 MED ORDER — PROPOFOL INFUSION 10 MG/ML OPTIME
INTRAVENOUS | Status: DC | PRN
  Administered 2013-12-05: 75 ug/kg/min via INTRAVENOUS

## 2013-12-05 MED ORDER — SODIUM CHLORIDE 0.9 % IV SOLN
INTRAVENOUS | Status: DC
  Administered 2013-12-05: 09:00:00 via INTRAVENOUS

## 2013-12-05 MED ORDER — HEPARIN SODIUM (PORCINE) 5000 UNIT/ML IJ SOLN
INTRAMUSCULAR | Status: DC | PRN
  Administered 2013-12-05: 10:00:00

## 2013-12-05 MED ORDER — OXYCODONE-ACETAMINOPHEN 5-325 MG PO TABS: 1.0000 | ORAL_TABLET | Freq: Four times a day (QID) | ORAL | Status: DC | PRN

## 2013-12-05 MED ORDER — HEPARIN SODIUM (PORCINE) 1000 UNIT/ML IJ SOLN
INTRAMUSCULAR | Status: DC | PRN
  Administered 2013-12-05: 3000 [IU] via INTRAVENOUS

## 2013-12-05 MED ORDER — MIDAZOLAM HCL 2 MG/2ML IJ SOLN
INTRAMUSCULAR | Status: AC
  Filled 2013-12-05: qty 2

## 2013-12-05 MED ORDER — LIDOCAINE HCL (CARDIAC) 20 MG/ML IV SOLN
INTRAVENOUS | Status: DC | PRN
  Administered 2013-12-05: 60 mg via INTRAVENOUS

## 2013-12-05 MED ORDER — HYDROMORPHONE HCL PF 1 MG/ML IJ SOLN
INTRAMUSCULAR | Status: AC
  Filled 2013-12-05: qty 1

## 2013-12-05 MED ORDER — FENTANYL CITRATE 0.05 MG/ML IJ SOLN
INTRAMUSCULAR | Status: DC | PRN
  Administered 2013-12-05: 50 ug via INTRAVENOUS

## 2013-12-05 MED ORDER — LIDOCAINE-EPINEPHRINE (PF) 1 %-1:200000 IJ SOLN
INTRAMUSCULAR | Status: DC | PRN
  Administered 2013-12-05: 15 mL

## 2013-12-05 MED ORDER — CHLORHEXIDINE GLUCONATE CLOTH 2 % EX PADS: 6.0000 | MEDICATED_PAD | Freq: Once | CUTANEOUS | Status: DC

## 2013-12-05 MED ORDER — HYDROMORPHONE HCL PF 1 MG/ML IJ SOLN: 0.2500 mg | INTRAMUSCULAR | Status: DC | PRN

## 2013-12-05 MED ORDER — ONDANSETRON HCL 4 MG/2ML IJ SOLN: 4.0000 mg | Freq: Once | INTRAMUSCULAR | Status: DC | PRN

## 2013-12-05 MED ORDER — LIDOCAINE HCL (CARDIAC) 20 MG/ML IV SOLN
INTRAVENOUS | Status: AC
  Filled 2013-12-05: qty 10

## 2013-12-05 MED ORDER — OXYCODONE HCL 5 MG PO TABS: 5.0000 mg | ORAL_TABLET | ORAL | Status: DC | PRN

## 2013-12-05 MED ORDER — PROPOFOL 10 MG/ML IV BOLUS
INTRAVENOUS | Status: AC
  Filled 2013-12-05: qty 20

## 2013-12-05 MED ORDER — HYDROMORPHONE HCL PF 1 MG/ML IJ SOLN
0.5000 mg | INTRAMUSCULAR | Status: DC | PRN
  Administered 2013-12-05: 0.5 mg via INTRAVENOUS

## 2013-12-05 SURGICAL SUPPLY — 41 items
CANISTER SUCTION 2500CC (MISCELLANEOUS) ×3 IMPLANT
CLIP TI MEDIUM 24 (CLIP) IMPLANT
CLIP TI MEDIUM 6 (CLIP) IMPLANT
CLIP TI WIDE RED SMALL 24 (CLIP) IMPLANT
CLIP TI WIDE RED SMALL 6 (CLIP) IMPLANT
COVER PROBE W GEL 5X96 (DRAPES) ×3 IMPLANT
COVER SURGICAL LIGHT HANDLE (MISCELLANEOUS) ×3 IMPLANT
DERMABOND ADVANCED (GAUZE/BANDAGES/DRESSINGS) ×2
DERMABOND ADVANCED .7 DNX12 (GAUZE/BANDAGES/DRESSINGS) ×1 IMPLANT
ELECT REM PT RETURN 9FT ADLT (ELECTROSURGICAL) ×3
ELECTRODE REM PT RTRN 9FT ADLT (ELECTROSURGICAL) ×1 IMPLANT
GLOVE BIO SURGEON STRL SZ7.5 (GLOVE) ×3 IMPLANT
GLOVE BIOGEL PI IND STRL 6.5 (GLOVE) ×1 IMPLANT
GLOVE BIOGEL PI IND STRL 7.0 (GLOVE) ×1 IMPLANT
GLOVE BIOGEL PI IND STRL 7.5 (GLOVE) ×1 IMPLANT
GLOVE BIOGEL PI IND STRL 8 (GLOVE) ×2 IMPLANT
GLOVE BIOGEL PI INDICATOR 6.5 (GLOVE) ×2
GLOVE BIOGEL PI INDICATOR 7.0 (GLOVE) ×2
GLOVE BIOGEL PI INDICATOR 7.5 (GLOVE) ×2
GLOVE BIOGEL PI INDICATOR 8 (GLOVE) ×4
GLOVE ECLIPSE 6.5 STRL STRAW (GLOVE) ×6 IMPLANT
GLOVE SURG SS PI 7.5 STRL IVOR (GLOVE) ×3 IMPLANT
GOWN STRL REUS W/ TWL LRG LVL3 (GOWN DISPOSABLE) ×2 IMPLANT
GOWN STRL REUS W/ TWL XL LVL3 (GOWN DISPOSABLE) ×1 IMPLANT
GOWN STRL REUS W/TWL LRG LVL3 (GOWN DISPOSABLE) ×4
GOWN STRL REUS W/TWL XL LVL3 (GOWN DISPOSABLE) ×2
HEMOSTAT SNOW SURGICEL 2X4 (HEMOSTASIS) IMPLANT
KIT BASIN OR (CUSTOM PROCEDURE TRAY) ×3 IMPLANT
KIT ROOM TURNOVER OR (KITS) ×3 IMPLANT
NS IRRIG 1000ML POUR BTL (IV SOLUTION) ×3 IMPLANT
PACK CV ACCESS (CUSTOM PROCEDURE TRAY) ×3 IMPLANT
PAD ARMBOARD 7.5X6 YLW CONV (MISCELLANEOUS) ×6 IMPLANT
SUT PROLENE 6 0 CC (SUTURE) ×3 IMPLANT
SUT SILK 2 0 SH (SUTURE) ×3 IMPLANT
SUT VIC AB 3-0 SH 27 (SUTURE) ×2
SUT VIC AB 3-0 SH 27X BRD (SUTURE) ×1 IMPLANT
SUT VICRYL 4-0 PS2 18IN ABS (SUTURE) IMPLANT
TOWEL OR 17X24 6PK STRL BLUE (TOWEL DISPOSABLE) ×3 IMPLANT
TOWEL OR 17X26 10 PK STRL BLUE (TOWEL DISPOSABLE) ×3 IMPLANT
UNDERPAD 30X30 INCONTINENT (UNDERPADS AND DIAPERS) ×3 IMPLANT
WATER STERILE IRR 1000ML POUR (IV SOLUTION) ×3 IMPLANT

## 2013-12-05 NOTE — Interval H&P Note (Signed)
History and Physical Interval Note:  12/05/2013 9:20 AM  Donna Sweeney  has presented today for surgery, with the diagnosis of Chronic kidney disease  The various methods of treatment have been discussed with the patient and family. After consideration of risks, benefits and other options for treatment, the patient has consented to  Procedure(s): FIRST STAGE OF BASILIC VEIN TRANSPOSITION VS TOTAL BVT (Left) as a surgical intervention .  The patient's history has been reviewed, patient examined, no change in status, stable for surgery.  I have reviewed the patient's chart and labs.  Questions were answered to the patient's satisfaction.     Serafina Mitchell

## 2013-12-05 NOTE — Discharge Instructions (Signed)

## 2013-12-05 NOTE — Addendum Note (Signed)
Addendum created 12/05/13 1225 by Kate Sable, MD   Modules edited: Orders, PRL Based Order Sets

## 2013-12-05 NOTE — Progress Notes (Signed)
Received report from Rachael Darby

## 2013-12-05 NOTE — Transfer of Care (Signed)
Immediate Anesthesia Transfer of Care Note  Patient: Donna Sweeney  Procedure(s) Performed: Procedure(s): First Stage Bascilic Vein Transpostion (Left)  Patient Location: PACU  Anesthesia Type:MAC  Level of Consciousness: awake, alert  and oriented  Airway & Oxygen Therapy: Patient Spontanous Breathing and Patient connected to nasal cannula oxygen  Post-op Assessment: Report given to PACU RN, Post -op Vital signs reviewed and stable and Patient moving all extremities X 4  Post vital signs: Reviewed and stable  Complications: No apparent anesthesia complications

## 2013-12-05 NOTE — Anesthesia Procedure Notes (Signed)
Procedure Name: MAC Date/Time: 12/05/2013 9:53 AM Performed by: Neldon Newport Pre-anesthesia Checklist: Timeout performed, Patient being monitored, Suction available, Emergency Drugs available and Patient identified Patient Re-evaluated:Patient Re-evaluated prior to inductionOxygen Delivery Method: Simple face mask Placement Confirmation: positive ETCO2

## 2013-12-05 NOTE — Progress Notes (Signed)
Report given to philip rn as caregiver 

## 2013-12-05 NOTE — Anesthesia Postprocedure Evaluation (Signed)
  Anesthesia Post-op Note  Patient: Donna Sweeney  Procedure(s) Performed: Procedure(s): First Stage Bascilic Vein Transpostion (Left)  Patient Location: PACU  Anesthesia Type:MAC  Level of Consciousness: awake, alert , oriented and patient cooperative  Airway and Oxygen Therapy: Patient Spontanous Breathing  Post-op Pain: mild  Post-op Assessment: Post-op Vital signs reviewed, Patient's Cardiovascular Status Stable, Respiratory Function Stable, Patent Airway and No signs of Nausea or vomiting  Post-op Vital Signs: stable  Last Vitals:  Filed Vitals:   12/05/13 0750  BP: 192/73  Pulse: 65  Resp: 18    Complications: No apparent anesthesia complications

## 2013-12-05 NOTE — Anesthesia Preprocedure Evaluation (Signed)
Anesthesia Evaluation  Patient identified by MRN, date of birth, ID band  Reviewed: Allergy & Precautions, H&P , NPO status , Patient's Chart, lab work & pertinent test results, reviewed documented beta blocker date and time   Airway Mallampati: II      Dental  (+) Teeth Intact, Dental Advidsory Given   Pulmonary asthma , sleep apnea ,          Cardiovascular hypertension, + angina + CAD, + Past MI, + Peripheral Vascular Disease and +CHF + Valvular Problems/Murmurs Rhythm:regular     Neuro/Psych  Neuromuscular disease    GI/Hepatic hiatal hernia, GERD-  Medicated and Controlled,  Endo/Other  diabetesHypothyroidism   Renal/GU ESRFRenal disease     Musculoskeletal   Abdominal   Peds  Hematology  (+) anemia ,   Anesthesia Other Findings   Reproductive/Obstetrics                           Anesthesia Physical Anesthesia Plan  ASA: III  Anesthesia Plan: MAC   Post-op Pain Management:    Induction:   Airway Management Planned:   Additional Equipment:   Intra-op Plan:   Post-operative Plan:   Informed Consent:   Dental Advisory Given  Plan Discussed with: CRNA, Surgeon and Anesthesiologist  Anesthesia Plan Comments:         Anesthesia Quick Evaluation

## 2013-12-05 NOTE — H&P (View-Only) (Signed)
   Patient name: Donna Sweeney MRN: [id number] DOB: 11/17/1945 Sex: female   Referred by: Dr. Goldsborough  Reason for referral:  Chief Complaint  Patient presents with  . New Evaluation    eval for HD access    HISTORY OF PRESENT ILLNESS: This is a very pleasant 68-year-old female who is right-handed, referred for dialysis access.  Her renal failure secondary to diabetes and hypertension.  She also has a history of congestive heart failure.  An echo in 2014 showed moderate LVH with an ejection fraction of 60-65% she also had elevated left ventricular filling pressures.  She'll he had mild pulmonary hypertension.  The patient suffers from sleep apnea and wears a CPAP machine at night.  She is medically managed for hypercholesterolemia with a statin.   Past Medical History  Diagnosis Date  . Hypertension   . CHF (congestive heart failure)   . Constipation   . Hypothyroidism   . GERD (gastroesophageal reflux disease)   . Hyperlipidemia   . Hx of seasonal allergies   . Fibromyalgia   . Arthritis   . Angina at rest   . Asthma   . Diabetes mellitus   . History of cardiac cath 2013,2005,1999    non obstructive CAD (done in [place]) EF 55%  . Herniated cervical disc   . Sciatica   . Diverticulitis   . Ulcerative colitis   . CAD (coronary artery disease)     non obstructive disease  . Biventricular heart failure   . Echocardiogram abnormal 10/17/12    EF 60-65%, grade I diastolic dysfunction, trivial MR, severely dilated LA  . Myocardial infarction   . Peripheral vascular disease   . Anemia   . Chronic kidney disease     stage 4 chronic kidney disease, lasr saw dr goldsbororugh  . Achilles tendon rupture 07-01-2013    right, wears boot to knee  . Bruises easily     both legs  . OSA (obstructive sleep apnea)     cpap setting of 9  . DVT (deep venous thrombosis)     Past Surgical History  Procedure Laterality Date  . Tonsillectomy    . Dilation and curettage of  uterus    . Cholecystectomy    . Oophorectomy    . Cystectomy    . Knee arthroscopy    . Replacement total knee      right  . Foot surgery      right  . Bunionectomy      right  . Hammer toe surgery      right  . Mole removal      right 5th toe  . Foot surgery      joint removed from great toe and pin inserted  . Cardiac catheterization  2013, 2005, 1999    non obstructive CAD  . Abdominal hysterectomy    . Revision total knee arthroplasty Right 2009    had hematoma afterwards  . Esophagogastroduodenoscopy (egd) with propofol N/A 09/04/2013    Procedure: ESOPHAGOGASTRODUODENOSCOPY (EGD) WITH PROPOFOL;  Surgeon: Vincent C. Schooler, MD;  Location: [facility] ENDOSCOPY;  Service: Endoscopy;  Laterality: N/A;  . Colonoscopy with propofol N/A 09/04/2013    Procedure: COLONOSCOPY WITH PROPOFOL;  Surgeon: Vincent C. Schooler, MD;  Location: [facility] ENDOSCOPY;  Service: Endoscopy;  Laterality: N/A;    History   Social History  . Marital Status: Divorced    Spouse Name: N/A    Number of Children: N/A  . Years of Education: N/A     Occupational History  . Not on file.   Social History Main Topics  . Smoking status: Never Smoker   . Smokeless tobacco: Never Used  . Alcohol Use: No  . Drug Use: No  . Sexual Activity: No   Other Topics Concern  . Not on file   Social History Narrative  . No narrative on file    Family History  Problem Relation Age of Onset  . Diabetes type II Mother   . Hypertension Mother   . Hyperlipidemia Mother   . Kidney disease Mother   . Fibroids Mother   . Diabetes Mother   . Heart disease Mother   . Varicose Veins Mother   . Peripheral vascular disease Mother   . Hypertension Sister   . Hypertension Brother   . Hypertension Sister   . Hypertension Sister     Allergies as of 11/24/2013 - Review Complete 11/24/2013  Allergen Reaction Noted  . Ivp dye [iodinated diagnostic agents] Shortness Of Breath, Itching, and Swelling 05/26/2012  . Raspberry  Shortness Of Breath, Itching, and Swelling 05/26/2012    Current Outpatient Prescriptions on File Prior to Visit  Medication Sig Dispense Refill  . albuterol (PROVENTIL HFA;VENTOLIN HFA) 108 (90 BASE) MCG/ACT inhaler Inhale 2 puffs into the lungs 2 (two) times daily. For shortness of breath      . aspirin 81 MG chewable tablet Chew 81 mg by mouth every morning.       . calcium-vitamin D (OSCAL WITH D) 500-200 MG-UNIT per tablet Take 1 tablet by mouth 2 (two) times daily.      . carvedilol (COREG) 25 MG tablet Take 2 tablets in the AM take 1 & 1/2 - in the PM  120 tablet  4  . cholecalciferol (VITAMIN D) 1000 UNITS tablet Take 1,000 Units by mouth daily.      . colchicine 0.6 MG tablet Take 0.6 mg by mouth daily.      . cycloSPORINE (RESTASIS) 0.05 % ophthalmic emulsion Place 1 drop into both eyes 2 (two) times daily.      . DULoxetine (CYMBALTA) 60 MG capsule Take 60 mg by mouth every evening.       . fenofibrate (TRICOR) 145 MG tablet Take 145 mg by mouth every morning.       . fish oil-omega-3 fatty acids 1000 MG capsule Take 1 g by mouth 3 (three) times daily.      . fluticasone (FLONASE) 50 MCG/ACT nasal spray Place 2 sprays into the nose daily.      . furosemide (LASIX) 80 MG tablet Take 40-80 mg by mouth 2 (two) times daily. Take one in the morning and half at lunch time      . gabapentin (NEURONTIN) 300 MG capsule Take 300 mg by mouth 3 (three) times daily.      . hydrALAZINE (APRESOLINE) 50 MG tablet Take 50 mg by mouth 3 (three) times daily.      . insulin glargine (LANTUS) 100 UNIT/ML injection Inject 40 Units into the skin at bedtime.       Marland Kitchen lactulose, encephalopathy, (GENERLAC) 10 GM/15ML SOLN Take 10 g by mouth 2 (two) times daily.       Marland Kitchen levothyroxine (SYNTHROID, LEVOTHROID) 100 MCG tablet Take 100 mcg by mouth daily before breakfast.       . nitroGLYCERIN (NITROSTAT) 0.4 MG SL tablet Place 0.4 mg under the tongue every 5 (five) minutes as needed for chest pain. For chest pain       .  Oxycodone HCl 10 MG TABS Take 10 mg by mouth 2 (two) times daily.      . oxyCODONE-acetaminophen (PERCOCET/ROXICET) 5-325 MG per tablet Take 1 tablet by mouth daily. For pain      . pantoprazole (PROTONIX) 40 MG tablet Take 20 mg by mouth 2 (two) times daily.       . potassium chloride SA (K-DUR,KLOR-CON) 20 MEQ tablet Take 20 mEq by mouth daily.      . simvastatin (ZOCOR) 40 MG tablet Take 40 mg by mouth every evening.      . traZODone (DESYREL) 50 MG tablet Take 50 mg by mouth at bedtime.       . valsartan (DIOVAN) 320 MG tablet Take 320 mg by mouth at bedtime.      . VICTOZA 18 MG/3ML SOPN Inject 1.2 Units into the skin daily.       No current facility-administered medications on file prior to visit.     REVIEW OF SYSTEMS: Cardiovascular: No chest pain, chest pressure, palpitations, orthopnea, or dyspnea on exertion. No claudication or rest pain,  No history of DVT or phlebitis. Pulmonary: No productive cough, asthma or wheezing. Neurologic: No weakness, paresthesias, aphasia, or amaurosis. No dizziness. Hematologic: No bleeding problems or clotting disorders. Musculoskeletal: No joint pain or joint swelling. Gastrointestinal: No blood in stool or hematemesis Genitourinary: No dysuria or hematuria. Psychiatric:: No history of major depression. Integumentary: No rashes or ulcers. Constitutional: No fever or chills.  PHYSICAL EXAMINATION: General: The patient appears their stated age.  Vital signs are BP 188/86  Pulse 65  Ht 5' 8" (1.727 m)  Wt 275 lb (124.739 kg)  BMI 41.82 kg/m2  SpO2 98% HEENT:  No gross abnormalities Pulmonary: Respirations are non-labored Abdomen: Soft and non-tender  Musculoskeletal: There are no major deformities.  Uses a walker. Neurologic: No focal weakness or paresthesias are detected, Skin: There are no ulcer or rashes noted. Psychiatric: The patient has normal affect. Cardiovascular: There is a regular rate and rhythm without significant  murmur appreciated.  Diagnostic Studies: Vein mapping was ordered and reviewed.  She has a marginal basilic vein below the antecubital crease.  Proximal to this and appears to be adequate.  The cephalic vein is not visualized in the left upper arm.   Assessment:  End-stage renal disease Plan: I discussed proceeding with a first stage vs. formal basilic vein transposition on the left.  I discussed the risks and benefits of the operation including the risk of steal, the risk of infection, and the need for potential future interventions.  I told the patient that I would evaluate her cephalic vein in the forearm to see if she would be a candidate for a radiocephalic fistula.  I also will make a determination during her operation as to whether or not she would be best treated with a two-stage basilic vein transposition, or having the entire procedure done in one setting.  This is been scheduled for Friday, April 17.     V. Wells Brabham IV, M.D. [facility] Office: [phone] Pager:  [phone]   

## 2013-12-05 NOTE — Addendum Note (Signed)
Addendum created 12/05/13 1120 by Neldon Newport, CRNA   Modules edited: Anesthesia Responsible Staff

## 2013-12-05 NOTE — Op Note (Signed)
    Patient name: Donna Sweeney MRN: [id number] DOB: August 10, 1946 Sex: female  12/05/2013 Pre-operative Diagnosis: End stage renal disease Post-operative diagnosis:  Same Surgeon:  Serafina Mitchell Assistants:  Lennie Muckle Procedure:   First stage left basilic vein transposition Anesthesia:  MAC Blood Loss:  See anesthesia record Specimens:  None  Findings:  The basilic vein branches just above the antecubital crease.  There was a large branch that coursed over towards the brachial artery that I utilize for the anastomosis.  Indications:  The patient had marginal cephalic and basilic vein preoperatively by ultrasound.  She comes in today for an attempt at fistula creation.  Procedure:  The patient was identified in the holding area and taken to [place] [facility]  The patient was then placed supine on the table. MAC anesthesia was administered.  The patient was prepped and draped in the usual sterile fashion.  A time out was called and antibiotics were administered.  One percent lidocaine was used for local anesthesia.  Ultrasound was used to map the course of the basilic vein in the upper arm.  In the arm just above the antecubital crease there was a large branch point of the basilic vein.  The best branch coursed medially towards the brachial artery.  I elected to use this branch for the basilic vein creation.  A transverse incision was made at the antecubital crease.  Cautery was used to divide the subcutaneous tissue.  I identified the branch of the basilic vein within this incision.  This was an excellent caliber vein approximately 4.5 mm.  It was circumferentially dissected free, fully exposed throughout the length of the incision and marked with an ink pen.  I reflected the vein medially and then dissected out the brachial artery.  Again this was an excellent disease-free artery measuring approximately 4 mm.  Once this was circumferentially exposed, I administered 3000 units of heparin.  The vein  was then ligated distally with a 2-0 silk ties.  The artery was occluded with vascular clamps.  A #11 blade was used to make an arteriotomy which was extended longitudinally with Potts scissors.  The vein was then cut to the appropriate length and then spatulated to fit the size of the arteriotomy.  A running anastomosis was created with 6-0 Prolene.  Prior to completion, the upper.  Flushing maneuvers were performed, and the anastomosis was completed.  There was an excellent thrill within the vein.  The patient had preservation of the radial and ulnar Doppler signals at the wrist.  Next, 25 mg of protamine were administered.  Once hemostasis was satisfactory, the incision was closed with 2 layers of 3-0 Vicryl and Dermabond.  There were no immediate complications.   Disposition:  To PACU in stable condition.   Theotis Burrow, M.D. [facility] Office: [phone] Pager:  [phone]

## 2013-12-08 ENCOUNTER — Telehealth: Payer: Self-pay | Admitting: Surgery

## 2013-12-08 ENCOUNTER — Encounter (HOSPITAL_COMMUNITY): Payer: Self-pay | Admitting: Surgery

## 2013-12-08 NOTE — Telephone Encounter (Addendum)
Message copied by Gena Fray on Mon Dec 08, 2013 10:20 AM ------      Message from: Mena Goes      Created: Fri Dec 05, 2013 10:58 AM      Regarding: Schedule                   ----- Message -----         From: Ulyses Amor, PA-C         Sent: 12/05/2013  10:43 AM           To: [facility] Charge Pool            F/U in office with Dr. Trula Slade in 6 weeks S/P AV fistula creation ------  12/08/13: mailed letter, as pt was being d/c at time of call, dpm

## 2013-12-17 ENCOUNTER — Ambulatory Visit
Admission: RE | Admit: 2013-12-17 | Discharge: 2013-12-17 | Disposition: A | Discharge status: Home / Self Care | Payer: Medicare Other | Source: Ambulatory Visit | Attending: Family Medicine | Admitting: Family Medicine

## 2013-12-17 DIAGNOSIS — Z1231 Encounter for screening mammogram for malignant neoplasm of breast: Secondary | ICD-10-CM

## 2013-12-23 ENCOUNTER — Encounter (HOSPITAL_COMMUNITY): Payer: Self-pay | Admitting: Emergency Medicine

## 2013-12-23 ENCOUNTER — Emergency Department (HOSPITAL_COMMUNITY)
Admission: EM | Admit: 2013-12-23 | Discharge: 2013-12-24 | Disposition: A | Discharge status: Home / Self Care | Payer: Medicare Other | Attending: Emergency Medicine | Admitting: Emergency Medicine

## 2013-12-23 DIAGNOSIS — E785 Hyperlipidemia, unspecified: Secondary | ICD-10-CM | POA: Insufficient documentation

## 2013-12-23 DIAGNOSIS — N186 End stage renal disease: Secondary | ICD-10-CM | POA: Insufficient documentation

## 2013-12-23 DIAGNOSIS — I509 Heart failure, unspecified: Secondary | ICD-10-CM | POA: Insufficient documentation

## 2013-12-23 DIAGNOSIS — IMO0002 Reserved for concepts with insufficient information to code with codable children: Secondary | ICD-10-CM | POA: Insufficient documentation

## 2013-12-23 DIAGNOSIS — Z862 Personal history of diseases of the blood and blood-forming organs and certain disorders involving the immune mechanism: Secondary | ICD-10-CM | POA: Insufficient documentation

## 2013-12-23 DIAGNOSIS — K219 Gastro-esophageal reflux disease without esophagitis: Secondary | ICD-10-CM | POA: Insufficient documentation

## 2013-12-23 DIAGNOSIS — M109 Gout, unspecified: Secondary | ICD-10-CM

## 2013-12-23 DIAGNOSIS — R011 Cardiac murmur, unspecified: Secondary | ICD-10-CM | POA: Insufficient documentation

## 2013-12-23 DIAGNOSIS — G4733 Obstructive sleep apnea (adult) (pediatric): Secondary | ICD-10-CM | POA: Insufficient documentation

## 2013-12-23 DIAGNOSIS — I209 Angina pectoris, unspecified: Secondary | ICD-10-CM | POA: Insufficient documentation

## 2013-12-23 DIAGNOSIS — E119 Type 2 diabetes mellitus without complications: Secondary | ICD-10-CM | POA: Insufficient documentation

## 2013-12-23 DIAGNOSIS — I120 Hypertensive chronic kidney disease with stage 5 chronic kidney disease or end stage renal disease: Secondary | ICD-10-CM | POA: Insufficient documentation

## 2013-12-23 DIAGNOSIS — Z86718 Personal history of other venous thrombosis and embolism: Secondary | ICD-10-CM | POA: Insufficient documentation

## 2013-12-23 DIAGNOSIS — I252 Old myocardial infarction: Secondary | ICD-10-CM | POA: Insufficient documentation

## 2013-12-23 DIAGNOSIS — Z87828 Personal history of other (healed) physical injury and trauma: Secondary | ICD-10-CM | POA: Insufficient documentation

## 2013-12-23 DIAGNOSIS — I2510 Atherosclerotic heart disease of native coronary artery without angina pectoris: Secondary | ICD-10-CM | POA: Insufficient documentation

## 2013-12-23 DIAGNOSIS — IMO0001 Reserved for inherently not codable concepts without codable children: Secondary | ICD-10-CM | POA: Insufficient documentation

## 2013-12-23 DIAGNOSIS — Z9889 Other specified postprocedural states: Secondary | ICD-10-CM | POA: Insufficient documentation

## 2013-12-23 DIAGNOSIS — Z7982 Long term (current) use of aspirin: Secondary | ICD-10-CM | POA: Insufficient documentation

## 2013-12-23 DIAGNOSIS — Z794 Long term (current) use of insulin: Secondary | ICD-10-CM | POA: Insufficient documentation

## 2013-12-23 DIAGNOSIS — E039 Hypothyroidism, unspecified: Secondary | ICD-10-CM | POA: Insufficient documentation

## 2013-12-23 DIAGNOSIS — Z79899 Other long term (current) drug therapy: Secondary | ICD-10-CM | POA: Insufficient documentation

## 2013-12-23 DIAGNOSIS — J45909 Unspecified asthma, uncomplicated: Secondary | ICD-10-CM | POA: Insufficient documentation

## 2013-12-23 NOTE — ED Notes (Signed)
Pt states that her legs have been swelling since yesterday. L ankle is particularly painful. Saw PCP today who stated she may have gout but pt also has kidney failure. Newly placed fistula in L arm is not yet ready for dialysis. Alert and oriented.

## 2013-12-24 LAB — CBC WITH DIFFERENTIAL/PLATELET
BASOS PCT: 0 % (ref 0–1)
Basophils Absolute: 0 10*3/uL (ref 0.0–0.1)
EOS ABS: 0.2 10*3/uL (ref 0.0–0.7)
EOS PCT: 1 % (ref 0–5)
HCT: 32.2 % — ABNORMAL LOW (ref 36.0–46.0)
Hemoglobin: 10.6 g/dL — ABNORMAL LOW (ref 12.0–15.0)
LYMPHS ABS: 2.6 10*3/uL (ref 0.7–4.0)
Lymphocytes Relative: 22 % (ref 12–46)
MCH: 29.7 pg (ref 26.0–34.0)
MCHC: 32.9 g/dL (ref 30.0–36.0)
MCV: 90.2 fL (ref 78.0–100.0)
Monocytes Absolute: 1.4 10*3/uL — ABNORMAL HIGH (ref 0.1–1.0)
Monocytes Relative: 12 % (ref 3–12)
Neutro Abs: 7.7 10*3/uL (ref 1.7–7.7)
Neutrophils Relative %: 65 % (ref 43–77)
PLATELETS: 222 10*3/uL (ref 150–400)
RBC: 3.57 MIL/uL — ABNORMAL LOW (ref 3.87–5.11)
RDW: 13.9 % (ref 11.5–15.5)
WBC: 11.9 10*3/uL — ABNORMAL HIGH (ref 4.0–10.5)

## 2013-12-24 LAB — BASIC METABOLIC PANEL
BUN: 39 mg/dL — ABNORMAL HIGH (ref 6–23)
CO2: 28 mEq/L (ref 19–32)
Calcium: 9.7 mg/dL (ref 8.4–10.5)
Chloride: 96 mEq/L (ref 96–112)
Creatinine, Ser: 1.97 mg/dL — ABNORMAL HIGH (ref 0.50–1.10)
GFR calc Af Amer: 29 mL/min — ABNORMAL LOW (ref >90)
GFR calc non Af Amer: 25 mL/min — ABNORMAL LOW (ref >90)
GLUCOSE: 110 mg/dL — AB (ref 70–99)
Potassium: 3 mEq/L — ABNORMAL LOW (ref 3.7–5.3)
SODIUM: 140 meq/L (ref 137–147)

## 2013-12-24 LAB — PRO B NATRIURETIC PEPTIDE: Pro B Natriuretic peptide (BNP): 1524 pg/mL — ABNORMAL HIGH (ref 0–125)

## 2013-12-24 MED ORDER — COLCHICINE 0.6 MG PO TABS
0.6000 mg | ORAL_TABLET | Freq: Once | ORAL | Status: AC
  Administered 2013-12-24: 0.6 mg via ORAL
  Filled 2013-12-24 (×2): qty 1

## 2013-12-24 MED ORDER — PREDNISONE 20 MG PO TABS
60.0000 mg | ORAL_TABLET | Freq: Once | ORAL | Status: AC
  Administered 2013-12-24: 60 mg via ORAL
  Filled 2013-12-24: qty 3

## 2013-12-24 MED ORDER — HYDROMORPHONE HCL PF 1 MG/ML IJ SOLN
1.0000 mg | Freq: Once | INTRAMUSCULAR | Status: AC
  Administered 2013-12-24: 1 mg via INTRAMUSCULAR
  Filled 2013-12-24: qty 1

## 2013-12-24 MED ORDER — PREDNISONE 20 MG PO TABS: 60.0000 mg | ORAL_TABLET | Freq: Every day | ORAL | Status: DC

## 2013-12-24 MED ORDER — HYDROMORPHONE HCL 2 MG PO TABS: 2.0000 mg | ORAL_TABLET | ORAL | Status: DC | PRN

## 2013-12-24 NOTE — Discharge Instructions (Signed)
Gout °Gout is an inflammatory arthritis caused by a buildup of uric acid crystals in the joints. Uric acid is a chemical that is normally present in the blood. When the level of uric acid in the blood is too high it can form crystals that deposit in your joints and tissues. This causes joint redness, soreness, and swelling (inflammation). Repeat attacks are common. Over time, uric acid crystals can form into masses (tophi) near a joint, destroying bone and causing disfigurement. Gout is treatable and often preventable. °CAUSES  °The disease begins with elevated levels of uric acid in the blood. Uric acid is produced by your body when it breaks down a naturally found substance called purines. Certain foods you eat, such as meats and fish, contain high amounts of purines. Causes of an elevated uric acid level include: °· Being passed down from parent to child (heredity). °· Diseases that cause increased uric acid production (such as obesity, psoriasis, and certain cancers). °· Excessive alcohol use. °· Diet, especially diets rich in meat and seafood. °· Medicines, including certain cancer-fighting medicines (chemotherapy), water pills (diuretics), and aspirin. °· Chronic kidney disease. The kidneys are no longer able to remove uric acid well. °· Problems with metabolism. °Conditions strongly associated with gout include: °· Obesity. °· High blood pressure. °· High cholesterol. °· Diabetes. °Not everyone with elevated uric acid levels gets gout. It is not understood why some people get gout and others do not. Surgery, joint injury, and eating too much of certain foods are some of the factors that can lead to gout attacks. °SYMPTOMS  °· An attack of gout comes on quickly. It causes intense pain with redness, swelling, and warmth in a joint. °· Fever can occur. °· Often, only one joint is involved. Certain joints are more commonly involved: °· Base of the big toe. °· Knee. °· Ankle. °· Wrist. °· Finger. °Without  treatment, an attack usually goes away in a few days to weeks. Between attacks, you usually will not have symptoms, which is different from many other forms of arthritis. °DIAGNOSIS  °Your caregiver will suspect gout based on your symptoms and exam. In some cases, tests may be recommended. The tests may include: °· Blood tests. °· Urine tests. °· X-rays. °· Joint fluid exam. This exam requires a needle to remove fluid from the joint (arthrocentesis). Using a microscope, gout is confirmed when uric acid crystals are seen in the joint fluid. °TREATMENT  °There are two phases to gout treatment: treating the sudden onset (acute) attack and preventing attacks (prophylaxis). °· Treatment of an Acute Attack. °· Medicines are used. These include anti-inflammatory medicines or steroid medicines. °· An injection of steroid medicine into the affected joint is sometimes necessary. °· The painful joint is rested. Movement can worsen the arthritis. °· You may use warm or cold treatments on painful joints, depending which works best for you. °· Treatment to Prevent Attacks. °· If you suffer from frequent gout attacks, your caregiver may advise preventive medicine. These medicines are started after the acute attack subsides. These medicines either help your kidneys eliminate uric acid from your body or decrease your uric acid production. You may need to stay on these medicines for a very long time. °· The early phase of treatment with preventive medicine can be associated with an increase in acute gout attacks. For this reason, during the first few months of treatment, your caregiver may also advise you to take medicines usually used for acute gout treatment. Be sure you   understand your caregiver's directions. Your caregiver may make several adjustments to your medicine dose before these medicines are effective. °· Discuss dietary treatment with your caregiver or dietitian. Alcohol and drinks high in sugar and fructose and foods  such as meat, poultry, and seafood can increase uric acid levels. Your caregiver or dietician can advise you on drinks and foods that should be limited. °HOME CARE INSTRUCTIONS  °· Do not take aspirin to relieve pain. This raises uric acid levels. °· Only take over-the-counter or prescription medicines for pain, discomfort, or fever as directed by your caregiver. °· Rest the joint as much as possible. When in bed, keep sheets and blankets off painful areas. °· Keep the affected joint raised (elevated). °· Apply warm or cold treatments to painful joints. Use of warm or cold treatments depends on which works best for you. °· Use crutches if the painful joint is in your leg. °· Drink enough fluids to keep your urine clear or pale yellow. This helps your body get rid of uric acid. Limit alcohol, sugary drinks, and fructose drinks. °· Follow your dietary instructions. Pay careful attention to the amount of protein you eat. Your daily diet should emphasize fruits, vegetables, whole grains, and fat-free or low-fat milk products. Discuss the use of coffee, vitamin C, and cherries with your caregiver or dietician. These may be helpful in lowering uric acid levels. °· Maintain a healthy body weight. °SEEK MEDICAL CARE IF:  °· You develop diarrhea, vomiting, or any side effects from medicines. °· You do not feel better in 24 hours, or you are getting worse. °SEEK IMMEDIATE MEDICAL CARE IF:  °· Your joint becomes suddenly more tender, and you have chills or a fever. °MAKE SURE YOU:  °· Understand these instructions. °· Will watch your condition. °· Will get help right away if you are not doing well or get worse. °Document Released: 08/04/2000 Document Revised: 12/02/2012 Document Reviewed: 03/20/2012 °ExitCare® Patient Information ©2014 [employer]. ° °Purine Restricted Diet °A low-purine diet consists of foods that reduce uric acid made in your body. °INDICATIONS FOR USE  °Your caregiver may ask you to follow a low-purine  diet to reduce gout flairs.  °GUIDELINES  °Avoid high-purine foods, including all alcohol, yeast extracts taken as supplements, and sauces made from meats (like gravy). Do not eat high-purine meats, including anchovies, sardines, herring, mussels, tuna, codfish, scallops, trout, haddock, bacon, organ meats, tripe, goose, wild game, and sweetbreads.  °Grains °· Allowed/Recommended: All, except those listed to consume in moderation. °· Consume in Moderation: Oatmeal ( cup uncooked daily), wheat bran or germ (¼ cup daily), and whole grains. °Vegetables °· Allowed/Recommended: All, except those listed to consume in moderation. °· Consume in Moderation: Asparagus, cauliflower, spinach, mushrooms, and green peas (½ cup daily). °Fruit °· Allowed/Recommended: All. °· Consume in Moderation: None. °Meat and Meat Substitutes °· Allowed/Recommended: Eggs, nuts, and peanut butter. °· Consume in Moderation: Limit to 4 to 6 oz daily. Avoid high-purine meats. Lentils, peas, and dried beans (1 cup daily). °Milk °· Allowed/Recommended: All. Choose low-fat or skim when possible. °· Consume in Moderation: None. °Fats and Oils °· Allowed/Recommended: All. °· Consume in Moderation: None. °Beverages °· Allowed/Recommended: All, except those listed to avoid. °· Avoid: All alcohol. °Condiments/Miscellaneous °· Allowed/Recommended: All, except those listed to consume in moderation. °· Consume in Moderation: Bouillon and meat-based broths and soups. °Document Released: 12/02/2010 Document Revised: 10/30/2011 Document Reviewed: 12/02/2010 °[employer]® Patient Information ©2014 [employer]. ° °

## 2013-12-24 NOTE — ED Notes (Signed)
Left lower extremity warmer than right. Edema more pronounced on this side as well.

## 2013-12-24 NOTE — ED Provider Notes (Signed)
CSN: [id number]     Arrival date & time 12/23/13  2151 History   First MD Initiated Contact with Patient 12/23/13 2332     Chief Complaint  Patient presents with  . Foot Swelling     (Consider location/radiation/quality/duration/timing/severity/associated sxs/prior Treatment) HPI 68 yo female presents to the ER from home with complaint of left ankle/foot/toe pain, swelling in both legs, left greater than right.  She denies sob, chest pain, abdominal distension.  Pt was seen by pcp today who thought she might have gout, prescribed colchicine, which patient had run out of.  Pt reports she ran out a few weeks ago.  She had been otherwise fairly controlled with her gout.  Pt also has history of CHF and ESRD, but not yet on dialysis.  She is concerned the leg swelling may be more than just gout.  Pain is in left ankle and toe.  The area is red, hot.  No fevers, chills, no skin lesions or calf pain.   Past Medical History  Diagnosis Date  . Hypertension   . CHF (congestive heart failure)   . Constipation   . Hypothyroidism   . GERD (gastroesophageal reflux disease)   . Hyperlipidemia   . Hx of seasonal allergies   . Fibromyalgia   . Arthritis   . Angina at rest   . Asthma   . Diabetes mellitus   . History of cardiac cath [phone]    non obstructive CAD (done in [place]) EF 55%  . Herniated cervical disc   . Sciatica   . Diverticulitis   . Ulcerative colitis   . CAD (coronary artery disease)     non obstructive disease  . Biventricular heart failure   . Echocardiogram abnormal 10/17/12    EF 03-54%, grade I diastolic dysfunction, trivial MR, severely dilated LA  . Myocardial infarction   . Peripheral vascular disease   . Anemia   . Chronic kidney disease     stage 4 chronic kidney disease, lasr saw dr Debroah Baller  . Achilles tendon rupture 07-01-2013    right, wears boot to knee  . Bruises easily     both legs  . OSA (obstructive sleep apnea)     cpap setting of 9  . DVT  (deep venous thrombosis)   . Heart murmur   . H/O hiatal hernia    Past Surgical History  Procedure Laterality Date  . Tonsillectomy    . Dilation and curettage of uterus    . Cholecystectomy    . Oophorectomy    . Cystectomy    . Knee arthroscopy    . Replacement total knee      right  . Foot surgery      right  . Bunionectomy      right  . Hammer toe surgery      right  . Mole removal      right 5th toe  . Foot surgery      joint removed from great toe and pin inserted  . Cardiac catheterization  2013, 2005, 1999    non obstructive CAD  . Abdominal hysterectomy    . Revision total knee arthroplasty Right 2009    had hematoma afterwards  . Esophagogastroduodenoscopy (egd) with propofol N/A 09/04/2013    Procedure: ESOPHAGOGASTRODUODENOSCOPY (EGD) WITH PROPOFOL;  Surgeon: Lear Ng, MD;  Location: [facility];  Service: Endoscopy;  Laterality: N/A;  . Colonoscopy with propofol N/A 09/04/2013    Procedure: COLONOSCOPY WITH PROPOFOL;  Surgeon: Evette Doffing  Aloha Gell, MD;  Location: [facility];  Service: Endoscopy;  Laterality: N/A;  . Bascilic vein transposition Left 12/05/2013    Procedure: First Stage Bascilic Vein Transpostion;  Surgeon: Serafina Mitchell, MD;  Location[facility];  Service: Vascular;  Laterality: Left;   Family History  Problem Relation Age of Onset  . Diabetes type II Mother   . Hypertension Mother   . Hyperlipidemia Mother   . Kidney disease Mother   . Fibroids Mother   . Diabetes Mother   . Heart disease Mother   . Varicose Veins Mother   . Peripheral vascular disease Mother   . Hypertension Sister   . Hypertension Brother   . Hypertension Sister   . Hypertension Sister    History  Substance Use Topics  . Smoking status: Never Smoker   . Smokeless tobacco: Never Used  . Alcohol Use: No   OB History   Grav Para Term Preterm Abortions TAB SAB Ect Mult Living   1    1   1        Review of Systems  All other systems reviewed and are  negative. other than listed in hpi    Allergies  Ivp dye and Raspberry  Home Medications   Prior to Admission medications   Medication Sig Start Date End Date Taking? Authorizing Provider  albuterol (PROVENTIL HFA;VENTOLIN HFA) 108 (90 BASE) MCG/ACT inhaler Inhale 2 puffs into the lungs 2 (two) times daily as needed for shortness of breath. For shortness of breath   Yes Historical Provider, MD  aspirin 81 MG chewable tablet Chew 81 mg by mouth every morning.    Yes Historical Provider, MD  calcium-vitamin D (OSCAL WITH D) 500-200 MG-UNIT per tablet Take 1 tablet by mouth 2 (two) times daily.   Yes Historical Provider, MD  carvedilol (COREG) 25 MG tablet Take 37.5-50 mg by mouth 2 (two) times daily with a meal. Take 2 tablets in the morning and 1.5 tablets in the evening   Yes Historical Provider, MD  cholecalciferol (VITAMIN D) 1000 UNITS tablet Take 1,000 Units by mouth daily.   Yes Historical Provider, MD  cycloSPORINE (RESTASIS) 0.05 % ophthalmic emulsion Place 1 drop into both eyes 2 (two) times daily.   Yes Historical Provider, MD  diphenhydrAMINE (BENADRYL) 25 MG tablet Take 25 mg by mouth every other day.   Yes Historical Provider, MD  DULoxetine (CYMBALTA) 60 MG capsule Take 60 mg by mouth every evening.    Yes Historical Provider, MD  EPINEPHrine 0.3 mg/0.3 mL IJ SOAJ injection Inject 0.3 mg into the muscle once as needed (severe allergic reaction).   Yes Historical Provider, MD  fexofenadine (ALLEGRA) 180 MG tablet Take 180 mg by mouth every other day.   Yes Historical Provider, MD  fish oil-omega-3 fatty acids 1000 MG capsule Take 1 g by mouth 3 (three) times daily.   Yes Historical Provider, MD  fluticasone (FLONASE) 50 MCG/ACT nasal spray Place 2 sprays into the nose daily.   Yes Historical Provider, MD  furosemide (LASIX) 80 MG tablet Take 40-80 mg by mouth 2 (two) times daily. Take one tablet in the morning and half tablet at lunch time   Yes Historical Provider, MD   gabapentin (NEURONTIN) 300 MG capsule Take 300 mg by mouth 3 (three) times daily.   Yes Historical Provider, MD  hydrALAZINE (APRESOLINE) 50 MG tablet Take 50 mg by mouth 3 (three) times daily.   Yes Historical Provider, MD  insulin glargine (LANTUS) 100 UNIT/ML  injection Inject 40 Units into the skin at bedtime.    Yes Historical Provider, MD  lactulose, encephalopathy, (GENERLAC) 10 GM/15ML SOLN Take 10 g by mouth 2 (two) times daily.    Yes Historical Provider, MD  levothyroxine (SYNTHROID, LEVOTHROID) 100 MCG tablet Take 100 mcg by mouth daily before breakfast.    Yes Historical Provider, MD  nitroGLYCERIN (NITROSTAT) 0.4 MG SL tablet Place 0.4 mg under the tongue every 5 (five) minutes as needed for chest pain. For chest pain   Yes Historical Provider, MD  Oxycodone HCl 10 MG TABS Take 10 mg by mouth 2 (two) times daily.   Yes Historical Provider, MD  oxyCODONE-acetaminophen (PERCOCET/ROXICET) 5-325 MG per tablet Take 1 tablet by mouth every 6 (six) hours as needed for severe pain. For pain 12/05/13  Yes Ulyses Amor, PA-C  pantoprazole (PROTONIX) 20 MG tablet Take 20 mg by mouth 2 (two) times daily.   Yes Historical Provider, MD  potassium chloride (K-DUR) 10 MEQ tablet Take 10 mEq by mouth daily.  12/22/13  Yes Historical Provider, MD  simvastatin (ZOCOR) 40 MG tablet Take 40 mg by mouth every evening.   Yes Historical Provider, MD  SYMBICORT 80-4.5 MCG/ACT inhaler Inhale 2 puffs into the lungs 2 (two) times daily. 10/28/13  Yes Historical Provider, MD  tiZANidine (ZANAFLEX) 2 MG tablet Take 2 mg by mouth 3 (three) times daily with meals. 10/28/13  Yes Historical Provider, MD  traZODone (DESYREL) 50 MG tablet Take 50 mg by mouth at bedtime.    Yes Historical Provider, MD  valsartan (DIOVAN) 320 MG tablet Take 320 mg by mouth at bedtime.   Yes Historical Provider, MD  VICTOZA 18 MG/3ML SOPN Inject 1.2 Units into the skin every morning.  05/23/13  Yes Historical Provider, MD  HYDROmorphone  (DILAUDID) 2 MG tablet Take 1 tablet (2 mg total) by mouth every 4 (four) hours as needed for severe pain. 12/24/13   Kalman Drape, MD  NOVOLIN N RELION 100 UNIT/ML injection Inject 20 Units into the skin 2 (two) times daily before a meal.  12/18/13   Historical Provider, MD  predniSONE (DELTASONE) 20 MG tablet Take 3 tablets (60 mg total) by mouth daily. 12/24/13   Kalman Drape, MD   BP 207/86  Pulse 75  Temp(Src) 97.7 F (36.5 C) (Oral)  Resp 20  SpO2 95% Physical Exam  Nursing note and vitals reviewed. Constitutional: She is oriented to person, place, and time. She appears well-developed and well-nourished. She appears distressed.  HENT:  Head: Normocephalic and atraumatic.  Nose: Nose normal.  Mouth/Throat: Oropharynx is clear and moist.  Eyes: Conjunctivae and EOM are normal. Pupils are equal, round, and reactive to light.  Neck: Normal range of motion. Neck supple. No JVD present. No tracheal deviation present. No thyromegaly present.  Cardiovascular: Normal rate, regular rhythm, normal heart sounds and intact distal pulses.  Exam reveals no gallop and no friction rub.   No murmur heard. Pulmonary/Chest: Effort normal and breath sounds normal. No stridor. No respiratory distress. She has no wheezes. She has no rales. She exhibits no tenderness.  Abdominal: Soft. Bowel sounds are normal. She exhibits no distension and no mass. There is no tenderness. There is no rebound and no guarding.  Musculoskeletal: Normal range of motion. She exhibits edema (1+ on right 2+ on left) and tenderness.  Left ankle, foot, and great toe are very tender to even light touch with warmth, mild erythema.  No streaking, no lymphangitis signs, no  cellulitis  Lymphadenopathy:    She has no cervical adenopathy.  Neurological: She is alert and oriented to person, place, and time. She exhibits normal muscle tone. Coordination normal.  Skin: Skin is warm and dry. No rash noted. No erythema. No pallor.  Psychiatric:  She has a normal mood and affect. Her behavior is normal. Judgment and thought content normal.    ED Course  Procedures (including critical care time) Labs Review Labs Reviewed  CBC WITH DIFFERENTIAL - Abnormal; Notable for the following:    WBC 11.9 (*)    RBC 3.57 (*)    Hemoglobin 10.6 (*)    HCT 32.2 (*)    Monocytes Absolute 1.4 (*)    All other components within normal limits  BASIC METABOLIC PANEL - Abnormal; Notable for the following:    Potassium 3.0 (*)    Glucose, Bld 110 (*)    BUN 39 (*)    Creatinine, Ser 1.97 (*)    GFR calc non Af Amer 25 (*)    GFR calc Af Amer 29 (*)    All other components within normal limits  PRO B NATRIURETIC PEPTIDE - Abnormal; Notable for the following:    Pro B Natriuretic peptide (BNP) 1524.0 (*)    All other components within normal limits    Imaging Review No results found.   EKG Interpretation None      MDM   Final diagnoses:  Gout of foot    68 yo female with probable gout.  BNP elevated, but no signs of fluid overload.  Kidney function has improved slightly.  Will add on short burst of prednisone, dilaudid, crutches to help with gouty flare.    Kalman Drape, MD 12/24/13 0330

## 2014-01-02 ENCOUNTER — Encounter: Payer: Self-pay | Admitting: Nephrology

## 2014-01-16 ENCOUNTER — Encounter: Payer: Self-pay | Admitting: Surgery

## 2014-01-19 ENCOUNTER — Ambulatory Visit (INDEPENDENT_AMBULATORY_CARE_PROVIDER_SITE_OTHER)
Admission: RE | Admit: 2014-01-19 | Discharge: 2014-01-19 | Disposition: A | Discharge status: Home / Self Care | Payer: Medicare Other | Source: Ambulatory Visit | Attending: Surgery | Admitting: Surgery

## 2014-01-19 ENCOUNTER — Encounter (HOSPITAL_COMMUNITY): Payer: Self-pay | Admitting: Emergency Medicine

## 2014-01-19 ENCOUNTER — Encounter: Payer: Self-pay | Admitting: Surgery

## 2014-01-19 ENCOUNTER — Emergency Department (HOSPITAL_COMMUNITY): Payer: Medicare Other

## 2014-01-19 ENCOUNTER — Emergency Department (HOSPITAL_COMMUNITY)
Admission: EM | Admit: 2014-01-19 | Discharge: 2014-01-19 | Disposition: A | Discharge status: Home / Self Care | Payer: Medicare Other | Attending: Emergency Medicine | Admitting: Emergency Medicine

## 2014-01-19 ENCOUNTER — Ambulatory Visit (INDEPENDENT_AMBULATORY_CARE_PROVIDER_SITE_OTHER): Payer: Self-pay | Admitting: Surgery

## 2014-01-19 ENCOUNTER — Other Ambulatory Visit: Payer: Self-pay

## 2014-01-19 VITALS — BP 157/68 | HR 57 | Ht 68.0 in | Wt 271.0 lb

## 2014-01-19 DIAGNOSIS — E119 Type 2 diabetes mellitus without complications: Secondary | ICD-10-CM | POA: Insufficient documentation

## 2014-01-19 DIAGNOSIS — G4733 Obstructive sleep apnea (adult) (pediatric): Secondary | ICD-10-CM | POA: Insufficient documentation

## 2014-01-19 DIAGNOSIS — Z794 Long term (current) use of insulin: Secondary | ICD-10-CM | POA: Insufficient documentation

## 2014-01-19 DIAGNOSIS — J45909 Unspecified asthma, uncomplicated: Secondary | ICD-10-CM | POA: Insufficient documentation

## 2014-01-19 DIAGNOSIS — X500XXA Overexertion from strenuous movement or load, initial encounter: Secondary | ICD-10-CM | POA: Insufficient documentation

## 2014-01-19 DIAGNOSIS — M129 Arthropathy, unspecified: Secondary | ICD-10-CM | POA: Insufficient documentation

## 2014-01-19 DIAGNOSIS — Z862 Personal history of diseases of the blood and blood-forming organs and certain disorders involving the immune mechanism: Secondary | ICD-10-CM | POA: Insufficient documentation

## 2014-01-19 DIAGNOSIS — R071 Chest pain on breathing: Secondary | ICD-10-CM | POA: Insufficient documentation

## 2014-01-19 DIAGNOSIS — Z79899 Other long term (current) drug therapy: Secondary | ICD-10-CM | POA: Insufficient documentation

## 2014-01-19 DIAGNOSIS — I509 Heart failure, unspecified: Secondary | ICD-10-CM | POA: Insufficient documentation

## 2014-01-19 DIAGNOSIS — I129 Hypertensive chronic kidney disease with stage 1 through stage 4 chronic kidney disease, or unspecified chronic kidney disease: Secondary | ICD-10-CM | POA: Insufficient documentation

## 2014-01-19 DIAGNOSIS — K219 Gastro-esophageal reflux disease without esophagitis: Secondary | ICD-10-CM | POA: Insufficient documentation

## 2014-01-19 DIAGNOSIS — Z8709 Personal history of other diseases of the respiratory system: Secondary | ICD-10-CM | POA: Insufficient documentation

## 2014-01-19 DIAGNOSIS — Z4931 Encounter for adequacy testing for hemodialysis: Secondary | ICD-10-CM

## 2014-01-19 DIAGNOSIS — IMO0002 Reserved for concepts with insufficient information to code with codable children: Secondary | ICD-10-CM | POA: Insufficient documentation

## 2014-01-19 DIAGNOSIS — E785 Hyperlipidemia, unspecified: Secondary | ICD-10-CM | POA: Insufficient documentation

## 2014-01-19 DIAGNOSIS — Z86718 Personal history of other venous thrombosis and embolism: Secondary | ICD-10-CM | POA: Insufficient documentation

## 2014-01-19 DIAGNOSIS — E039 Hypothyroidism, unspecified: Secondary | ICD-10-CM | POA: Insufficient documentation

## 2014-01-19 DIAGNOSIS — Z87828 Personal history of other (healed) physical injury and trauma: Secondary | ICD-10-CM | POA: Insufficient documentation

## 2014-01-19 DIAGNOSIS — I209 Angina pectoris, unspecified: Secondary | ICD-10-CM | POA: Insufficient documentation

## 2014-01-19 DIAGNOSIS — M25519 Pain in unspecified shoulder: Secondary | ICD-10-CM | POA: Insufficient documentation

## 2014-01-19 DIAGNOSIS — Z7982 Long term (current) use of aspirin: Secondary | ICD-10-CM | POA: Insufficient documentation

## 2014-01-19 DIAGNOSIS — S93402A Sprain of unspecified ligament of left ankle, initial encounter: Secondary | ICD-10-CM

## 2014-01-19 DIAGNOSIS — R011 Cardiac murmur, unspecified: Secondary | ICD-10-CM | POA: Insufficient documentation

## 2014-01-19 DIAGNOSIS — Z9889 Other specified postprocedural states: Secondary | ICD-10-CM | POA: Insufficient documentation

## 2014-01-19 DIAGNOSIS — N186 End stage renal disease: Secondary | ICD-10-CM

## 2014-01-19 DIAGNOSIS — Y929 Unspecified place or not applicable: Secondary | ICD-10-CM | POA: Insufficient documentation

## 2014-01-19 DIAGNOSIS — N184 Chronic kidney disease, stage 4 (severe): Secondary | ICD-10-CM | POA: Insufficient documentation

## 2014-01-19 DIAGNOSIS — I252 Old myocardial infarction: Secondary | ICD-10-CM | POA: Insufficient documentation

## 2014-01-19 DIAGNOSIS — Z9981 Dependence on supplemental oxygen: Secondary | ICD-10-CM | POA: Insufficient documentation

## 2014-01-19 DIAGNOSIS — I2510 Atherosclerotic heart disease of native coronary artery without angina pectoris: Secondary | ICD-10-CM | POA: Insufficient documentation

## 2014-01-19 DIAGNOSIS — S93409A Sprain of unspecified ligament of unspecified ankle, initial encounter: Secondary | ICD-10-CM | POA: Insufficient documentation

## 2014-01-19 DIAGNOSIS — R0789 Other chest pain: Secondary | ICD-10-CM

## 2014-01-19 DIAGNOSIS — Y939 Activity, unspecified: Secondary | ICD-10-CM | POA: Insufficient documentation

## 2014-01-19 LAB — CBC WITH DIFFERENTIAL/PLATELET
BASOS ABS: 0 10*3/uL (ref 0.0–0.1)
BASOS PCT: 0 % (ref 0–1)
EOS ABS: 0.3 10*3/uL (ref 0.0–0.7)
EOS PCT: 5 % (ref 0–5)
HCT: 32.2 % — ABNORMAL LOW (ref 36.0–46.0)
Hemoglobin: 10.4 g/dL — ABNORMAL LOW (ref 12.0–15.0)
Lymphocytes Relative: 46 % (ref 12–46)
Lymphs Abs: 3.2 10*3/uL (ref 0.7–4.0)
MCH: 30.1 pg (ref 26.0–34.0)
MCHC: 32.3 g/dL (ref 30.0–36.0)
MCV: 93.3 fL (ref 78.0–100.0)
Monocytes Absolute: 0.6 10*3/uL (ref 0.1–1.0)
Monocytes Relative: 9 % (ref 3–12)
Neutro Abs: 2.8 10*3/uL (ref 1.7–7.7)
Neutrophils Relative %: 40 % — ABNORMAL LOW (ref 43–77)
PLATELETS: 225 10*3/uL (ref 150–400)
RBC: 3.45 MIL/uL — ABNORMAL LOW (ref 3.87–5.11)
RDW: 14 % (ref 11.5–15.5)
WBC: 7 10*3/uL (ref 4.0–10.5)

## 2014-01-19 LAB — COMPREHENSIVE METABOLIC PANEL
ALT: 23 U/L (ref 0–35)
AST: 28 U/L (ref 0–37)
Albumin: 3.4 g/dL — ABNORMAL LOW (ref 3.5–5.2)
Alkaline Phosphatase: 49 U/L (ref 39–117)
BUN: 49 mg/dL — ABNORMAL HIGH (ref 6–23)
CO2: 29 mEq/L (ref 19–32)
Calcium: 9.9 mg/dL (ref 8.4–10.5)
Chloride: 97 mEq/L (ref 96–112)
Creatinine, Ser: 2.72 mg/dL — ABNORMAL HIGH (ref 0.50–1.10)
GFR calc Af Amer: 20 mL/min — ABNORMAL LOW (ref >90)
GFR calc non Af Amer: 17 mL/min — ABNORMAL LOW (ref >90)
Glucose, Bld: 88 mg/dL (ref 70–99)
Potassium: 3.5 mEq/L — ABNORMAL LOW (ref 3.7–5.3)
SODIUM: 141 meq/L (ref 137–147)
TOTAL PROTEIN: 6.7 g/dL (ref 6.0–8.3)
Total Bilirubin: 0.5 mg/dL (ref 0.3–1.2)

## 2014-01-19 LAB — I-STAT TROPONIN, ED: Troponin i, poc: 0.04 ng/mL (ref 0.00–0.08)

## 2014-01-19 LAB — TROPONIN I

## 2014-01-19 MED ORDER — OXYCODONE HCL 5 MG PO TABS
10.0000 mg | ORAL_TABLET | Freq: Once | ORAL | Status: AC
  Administered 2014-01-19: 10 mg via ORAL
  Filled 2014-01-19: qty 2

## 2014-01-19 MED ORDER — MORPHINE SULFATE 4 MG/ML IJ SOLN
4.0000 mg | Freq: Once | INTRAMUSCULAR | Status: AC
Start: 2014-01-19 — End: 2014-01-19
  Administered 2014-01-19: 4 mg via INTRAVENOUS
  Filled 2014-01-19: qty 1

## 2014-01-19 NOTE — ED Notes (Signed)
Pt states that she tripped over a rug today and twisted her L ankle. States that she has had gout before in that ankle. Somewhat swollen.  Alert and oriented. Ambulatory.

## 2014-01-19 NOTE — ED Notes (Signed)
Patient transported to X-ray 

## 2014-01-19 NOTE — ED Provider Notes (Signed)
CSN: [id number]     Arrival date & time 01/19/14  1535 History   First MD Initiated Contact with Patient 01/19/14 1704     Chief Complaint  Patient presents with  . Ankle Injury   Patient is a 68 y.o. female presenting with lower extremity injury. The history is provided by the patient. No language interpreter was used.  Ankle Injury   This chart was scribed for non-physician practitioner working with Virgel Manifold, MD, by Thea Alken, ED Scribe. This patient was seen in room WTR7/WTR7 and the patient's care was started at 5:06 PM.  Donna Sweeney is a 68 y.o. female who presents to the Emergency Department complaining of left ankle injury onset . States she twisted her ankle when she tripped over a rug. Pt states she heard a snap. She now radiating pain described as burning from left ankle to calf. Pt denies taking medication for the pain.  Pt reports gout in this ankle in the past. Pt takes Oxycontin twice a day and oxycodone as needed but reports she left her medication at home. Pt wears ankle braces from tearing her achilles tendon. Pt has h/o of fracture in left ankle. Pt took the bus to be seen today.    Past Medical History  Diagnosis Date  . Hypertension   . CHF (congestive heart failure)   . Constipation   . Hypothyroidism   . GERD (gastroesophageal reflux disease)   . Hyperlipidemia   . Hx of seasonal allergies   . Fibromyalgia   . Arthritis   . Angina at rest   . Asthma   . Diabetes mellitus   . History of cardiac cath [phone]    non obstructive CAD (done in [place]) EF 55%  . Herniated cervical disc   . Sciatica   . Diverticulitis   . Ulcerative colitis   . CAD (coronary artery disease)     non obstructive disease  . Biventricular heart failure   . Echocardiogram abnormal 10/17/12    EF 01-02%, grade I diastolic dysfunction, trivial MR, severely dilated LA  . Myocardial infarction   . Peripheral vascular disease   . Anemia   . Chronic kidney disease      stage 4 chronic kidney disease, lasr saw dr Debroah Baller  . Achilles tendon rupture 07-01-2013    right, wears boot to knee  . Bruises easily     both legs  . OSA (obstructive sleep apnea)     cpap setting of 9  . DVT (deep venous thrombosis)   . Heart murmur   . H/O hiatal hernia    Past Surgical History  Procedure Laterality Date  . Tonsillectomy    . Dilation and curettage of uterus    . Cholecystectomy    . Oophorectomy    . Cystectomy    . Knee arthroscopy    . Replacement total knee      right  . Foot surgery      right  . Bunionectomy      right  . Hammer toe surgery      right  . Mole removal      right 5th toe  . Foot surgery      joint removed from great toe and pin inserted  . Cardiac catheterization  2013, 2005, 1999    non obstructive CAD  . Abdominal hysterectomy    . Revision total knee arthroplasty Right 2009    had hematoma afterwards  . Esophagogastroduodenoscopy (egd) with propofol  N/A 09/04/2013    Procedure: ESOPHAGOGASTRODUODENOSCOPY (EGD) WITH PROPOFOL;  Surgeon: Lear Ng, MD;  Location: [facility];  Service: Endoscopy;  Laterality: N/A;  . Colonoscopy with propofol N/A 09/04/2013    Procedure: COLONOSCOPY WITH PROPOFOL;  Surgeon: Lear Ng, MD;  Location: [facility];  Service: Endoscopy;  Laterality: N/A;  . Bascilic vein transposition Left 12/05/2013    Procedure: First Stage Bascilic Vein Transpostion;  Surgeon: Serafina Mitchell, MD;  Location: [facility] OR;  Service: Vascular;  Laterality: Left;   Family History  Problem Relation Age of Onset  . Diabetes type II Mother   . Hypertension Mother   . Hyperlipidemia Mother   . Kidney disease Mother   . Fibroids Mother   . Diabetes Mother   . Heart disease Mother   . Varicose Veins Mother   . Peripheral vascular disease Mother   . Hypertension Sister   . Hypertension Brother   . Hypertension Sister   . Hypertension Sister    History  Substance Use Topics  . Smoking  status: Never Smoker   . Smokeless tobacco: Never Used  . Alcohol Use: No   OB History   Grav Para Term Preterm Abortions TAB SAB Ect Mult Living   1    1   1        Review of Systems  Musculoskeletal: Positive for arthralgias. Negative for gait problem.  Neurological: Negative for weakness and numbness.      Allergies  Ivp dye and Raspberry  Home Medications   Prior to Admission medications   Medication Sig Start Date End Date Taking? Authorizing Provider  albuterol (PROVENTIL HFA;VENTOLIN HFA) 108 (90 BASE) MCG/ACT inhaler Inhale 2 puffs into the lungs 2 (two) times daily as needed for shortness of breath. For shortness of breath    Historical Provider, MD  aspirin 81 MG chewable tablet Chew 81 mg by mouth every morning.     Historical Provider, MD  calcium-vitamin D (OSCAL WITH D) 500-200 MG-UNIT per tablet Take 1 tablet by mouth 2 (two) times daily.    Historical Provider, MD  carvedilol (COREG) 25 MG tablet Take 37.5-50 mg by mouth 2 (two) times daily with a meal. Take 2 tablets in the morning and 1.5 tablets in the evening    Historical Provider, MD  cholecalciferol (VITAMIN D) 1000 UNITS tablet Take 1,000 Units by mouth daily.    Historical Provider, MD  COLCRYS 0.6 MG tablet Take 1 tablet by mouth 2 (two) times daily. 12/22/13   Historical Provider, MD  cycloSPORINE (RESTASIS) 0.05 % ophthalmic emulsion Place 1 drop into both eyes 2 (two) times daily.    Historical Provider, MD  diphenhydrAMINE (BENADRYL) 25 MG tablet Take 25 mg by mouth every other day.    Historical Provider, MD  DULoxetine (CYMBALTA) 60 MG capsule Take 60 mg by mouth every evening.     Historical Provider, MD  EPINEPHrine 0.3 mg/0.3 mL IJ SOAJ injection Inject 0.3 mg into the muscle once as needed (severe allergic reaction).    Historical Provider, MD  fexofenadine (ALLEGRA) 180 MG tablet Take 180 mg by mouth every other day.    Historical Provider, MD  fish oil-omega-3 fatty acids 1000 MG capsule Take 1 g  by mouth 3 (three) times daily.    Historical Provider, MD  fluticasone (FLONASE) 50 MCG/ACT nasal spray Place 2 sprays into the nose daily.    Historical Provider, MD  furosemide (LASIX) 80 MG tablet Take 40-80 mg by mouth 2 (two)  times daily. Take one tablet in the morning and half tablet at lunch time    Historical Provider, MD  gabapentin (NEURONTIN) 300 MG capsule Take 300 mg by mouth 3 (three) times daily.    Historical Provider, MD  hydrALAZINE (APRESOLINE) 50 MG tablet Take 50 mg by mouth 3 (three) times daily.    Historical Provider, MD  HYDROmorphone (DILAUDID) 2 MG tablet Take 1 tablet (2 mg total) by mouth every 4 (four) hours as needed for severe pain. 12/24/13   Kalman Drape, MD  insulin glargine (LANTUS) 100 UNIT/ML injection Inject 40 Units into the skin at bedtime.     Historical Provider, MD  lactulose, encephalopathy, (GENERLAC) 10 GM/15ML SOLN Take 10 g by mouth 2 (two) times daily.     Historical Provider, MD  levothyroxine (SYNTHROID, LEVOTHROID) 100 MCG tablet Take 100 mcg by mouth daily before breakfast.     Historical Provider, MD  nitroGLYCERIN (NITROSTAT) 0.4 MG SL tablet Place 0.4 mg under the tongue every 5 (five) minutes as needed for chest pain. For chest pain    Historical Provider, MD  NOVOLIN N RELION 100 UNIT/ML injection Inject 20 Units into the skin 2 (two) times daily before a meal.  12/18/13   Historical Provider, MD  NOVOLIN R 100 UNIT/ML injection Inject 5 Units into the skin as directed. 12/29/13   Historical Provider, MD  Oxycodone HCl 10 MG TABS Take 10 mg by mouth 2 (two) times daily.    Historical Provider, MD  pantoprazole (PROTONIX) 20 MG tablet Take 20 mg by mouth 2 (two) times daily.    Historical Provider, MD  potassium chloride (K-DUR) 10 MEQ tablet Take 20 mEq by mouth daily.  12/22/13   Historical Provider, MD  predniSONE (DELTASONE) 20 MG tablet Take 3 tablets (60 mg total) by mouth daily. 12/24/13   Kalman Drape, MD  simvastatin (ZOCOR) 40 MG tablet  Take 40 mg by mouth every evening.    Historical Provider, MD  SYMBICORT 80-4.5 MCG/ACT inhaler Inhale 2 puffs into the lungs 2 (two) times daily. 10/28/13   Historical Provider, MD  tiZANidine (ZANAFLEX) 2 MG tablet Take 2 mg by mouth 3 (three) times daily with meals. 10/28/13   Historical Provider, MD  traZODone (DESYREL) 50 MG tablet Take 50 mg by mouth at bedtime.     Historical Provider, MD  valsartan (DIOVAN) 320 MG tablet Take 320 mg by mouth at bedtime.    Historical Provider, MD  VICTOZA 18 MG/3ML SOPN Inject 1.2 Units into the skin every morning.  05/23/13   Historical Provider, MD   BP 160/53  Pulse 62  Temp(Src) 98 F (36.7 C) (Oral)  Resp 16  SpO2 99% Physical Exam  Nursing note and vitals reviewed. Constitutional: She appears well-developed and well-nourished. No distress.  Eyes: Conjunctivae are normal.  Neck: Neck supple.  Musculoskeletal:  Bilateral lower extremity swelling. Left ankle tender to palpation over lateral malleolus. Pain with range of motion. Joint is stable. Dorsal pedal pulses intact. Tenderness over left lateral anterior knee. Following emotion at the knee. Negative anterior posterior drawer signs. Achilles tendon is intact.  Neurological: She is alert.  Skin: Skin is warm and dry.    ED Course  Procedures (including critical care time) Labs Review Labs Reviewed - No data to display  Imaging Review Dg Chest 2 View  01/19/2014   CLINICAL DATA:  Mid chest pain  EXAM: CHEST  2 VIEW  COMPARISON:  12/02/2013.  FINDINGS: Cardiomegaly and aortic  tortuosity which is stable from prior. No consolidation, edema, effusion, or pneumothorax. Cholecystectomy changes.  IMPRESSION: Mild cardiomegaly.  No edema or pneumonia.   Electronically Signed   By: Jorje Guild M.D.   On: 01/19/2014 02:42   Dg Ankle Complete Left  01/19/2014   CLINICAL DATA:  Twisting injury to the left ankle. Pain and swelling.  EXAM: LEFT ANKLE COMPLETE - 3+ VIEW  COMPARISON:  None.  FINDINGS:  There are well corticated bone densities below the inferior margin of the distal fibula. This may reflect chronic avulsion fractures or accessory ossification centers. They appear to be chronic.  No evidence of acute fracture. Ankle mortise is normally space and aligned.  Bones are demineralized.  There is diffuse soft tissue swelling.  IMPRESSION: No acute fracture.  Ankle mortise normally aligned.   Electronically Signed   By: Lajean Manes M.D.   On: 01/19/2014 18:14   Dg Knee Complete 4 Views Left  01/19/2014   CLINICAL DATA:  Twisted ankle and knee, pain  EXAM: LEFT KNEE - COMPLETE 4+ VIEW  COMPARISON:  Concurrently obtained radiographs of the ankle  FINDINGS: No acute fracture, malalignment or knee joint effusion. Mild tricompartmental degenerative osteoarthritis. Enthesopathy at the superior and inferior patellar poles. Normal bony mineralization without lytic of blastic osseous lesion. Mild muscular atrophy. No acute soft tissue abnormality.  IMPRESSION: 1. No acute fracture, malalignment or effusion. 2. Mild tricompartmental degenerative arthritis.   Electronically Signed   By: Jacqulynn Cadet M.D.   On: 01/19/2014 18:12     EKG Interpretation None      MDM   Final diagnoses:  None    Patient is here after a left ankle twisting injury. There was no fall. X-rays obtained and are negative. Patient already has bilateral supportive ankle braces. She is already on oxycodone and OxyContin for her chronic pain. She has an orthopedic doctor that she follows with. We'll discharge her home, instructed to continue wearing the braces, continue her pain medications. Keep ankle elevated. Ice. Patient is ambulatory in her foot. Chills has crutches at home. She has a walker with her.  Filed Vitals:   01/19/14 1630 01/19/14 1858  BP: 160/53 165/70  Pulse: 62 55  Temp: 98 F (36.7 C) 98.3 F (36.8 C)  TempSrc: Oral Oral  Resp: 16 20  SpO2: 99% 96%     I personally performed the services  described in this documentation, which was scribed in my presence. The recorded information has been reviewed and is accurate.   Renold Genta, PA-C 01/19/14 2100

## 2014-01-19 NOTE — ED Notes (Signed)
Pt to ED via [facility] for evaluation of substernal chest pain that began while sitting tonight- pt reports pain radiated to right side of her jaw- denies shortness of breath.  Pt took 3 nitro and 324 ASA at home - pt initially pain free with EMS, upon arrival to exam room pt rating CP 3/10, admits to pain in jaw as well.  Pt is alert and oriented X 4- NSR on monitor, IV in place.  Pt reports productive cough yellow in color for the past week as well.

## 2014-01-19 NOTE — Discharge Instructions (Signed)
X-ray looks normal today. Keep legs elevated. Ice several times a day. Continue your pain medications. Follow up with Dr. Mayer Camel.    Ankle Sprain An ankle sprain is an injury to the strong, fibrous tissues (ligaments) that hold the bones of your ankle joint together.  CAUSES An ankle sprain is usually caused by a fall or by twisting your ankle. Ankle sprains most commonly occur when you step on the outer edge of your foot, and your ankle turns inward. People who participate in sports are more prone to these types of injuries.  SYMPTOMS   Pain in your ankle. The pain may be present at rest or only when you are trying to stand or walk.  Swelling.  Bruising. Bruising may develop immediately or within 1 to 2 days after your injury.  Difficulty standing or walking, particularly when turning corners or changing directions. DIAGNOSIS  Your caregiver will ask you details about your injury and perform a physical exam of your ankle to determine if you have an ankle sprain. During the physical exam, your caregiver will press on and apply pressure to specific areas of your foot and ankle. Your caregiver will try to move your ankle in certain ways. An X-ray exam may be done to be sure a bone was not broken or a ligament did not separate from one of the bones in your ankle (avulsion fracture).  TREATMENT  Certain types of braces can help stabilize your ankle. Your caregiver can make a recommendation for this. Your caregiver may recommend the use of medicine for pain. If your sprain is severe, your caregiver may refer you to a surgeon who helps to restore function to parts of your skeletal system (orthopedist) or a physical therapist. [place] ice to your injury for 1 2 days or as directed by your caregiver. Applying ice helps to reduce inflammation and pain.  Put ice in a plastic bag.  Place a towel between your skin and the bag.  Leave the ice on for 15-20 minutes at a time, every  2 hours while you are awake.  Only take over-the-counter or prescription medicines for pain, discomfort, or fever as directed by your caregiver.  Elevate your injured ankle above the level of your heart as much as possible for 2 3 days.  If your caregiver recommends crutches, use them as instructed. Gradually put weight on the affected ankle. Continue to use crutches or a cane until you can walk without feeling pain in your ankle.  If you have a plaster splint, wear the splint as directed by your caregiver. Do not rest it on anything harder than a pillow for the first 24 hours. Do not put weight on it. Do not get it wet. You may take it off to take a shower or bath.  You may have been given an elastic bandage to wear around your ankle to provide support. If the elastic bandage is too tight (you have numbness or tingling in your foot or your foot becomes cold and blue), adjust the bandage to make it comfortable.  If you have an air splint, you may blow more air into it or let air out to make it more comfortable. You may take your splint off at night and before taking a shower or bath. Wiggle your toes in the splint several times per day to decrease swelling. SEEK MEDICAL CARE IF:   You have rapidly increasing bruising or swelling.  Your toes feel extremely cold or  you lose feeling in your foot.  Your pain is not relieved with medicine. SEEK IMMEDIATE MEDICAL CARE IF:  Your toes are numb or blue.  You have severe pain that is increasing. MAKE SURE YOU:   Understand these instructions.  Will watch your condition.  Will get help right away if you are not doing well or get worse. Document Released: 08/07/2005 Document Revised: 05/01/2012 Document Reviewed: 08/19/2011 [facility] Patient Information 2014 [place], [place].

## 2014-01-19 NOTE — Discharge Instructions (Signed)
Chest Pain Observation It is often hard to give a specific diagnosis for the cause of chest pain. Among other possibilities your symptoms might be caused by inadequate oxygen delivery to your heart (angina). Angina that is not treated or evaluated can lead to a heart attack (myocardial infarction) or death. Blood tests, electrocardiograms, and X-rays may have been done to help determine a possible cause of your chest pain. After evaluation and observation, your health care provider has determined that it is unlikely your pain was caused by an unstable condition that requires hospitalization. However, a full evaluation of your pain may need to be completed, with additional diagnostic testing as directed. It is very important to keep your follow-up appointments. Not keeping your follow-up appointments could result in permanent heart damage, disability, or death. If there is any problem keeping your follow-up appointments, you must call your health care provider. HOME CARE INSTRUCTIONS  Due to the slight chance that your pain could be angina, it is important to follow your health care provider's treatment plan and also maintain a healthy lifestyle:  Maintain or work toward achieving a healthy weight.  Stay physically active and exercise regularly.  Decrease your salt intake.  Eat a balanced, healthy diet. Talk to a dietician to learn about heart healthy foods.  Increase your fiber intake by including whole grains, vegetables, fruits, and nuts in your diet.  Avoid situations that cause stress, anger, or depression.  Take medicines as advised by your health care provider. Report any side effects to your health care provider. Do not stop medicines or adjust the dosages on your own.  Quit smoking. Do not use nicotine patches or gum until you check with your health care provider.  Keep your blood pressure, blood sugar, and cholesterol levels within normal limits.  Limit alcohol intake to no more than  1 drink per day for women that are not pregnant and 2 drinks per day for men.  Do not abuse drugs. SEEK IMMEDIATE MEDICAL CARE IF: You have severe chest pain or pressure which may include symptoms such as:  You feel pain or pressure in you arms, neck, jaw, or back.  You have severe back or abdominal pain, feel sick to your stomach (nauseous), or throw up (vomit).  You are sweating profusely.  You are having a fast or irregular heartbeat.  You feel short of breath while at rest.  You notice increasing shortness of breath during rest, sleep, or with activity.  You have chest pain that does not get better after rest or after taking your usual medicine.  You wake from sleep with chest pain.  You are unable to sleep because you cannot breathe.  You develop a frequent cough or you are coughing up blood.  You feel dizzy, faint, or experience extreme fatigue.  You develop severe weakness, dizziness, fainting, or chills. Any of these symptoms may represent a serious problem that is an emergency. Do not wait to see if the symptoms will go away. Call your local emergency services (911 in the [place] Do not drive yourself to the hospital. MAKE SURE YOU:  Understand these instructions.  Will watch your condition.  Will get help right away if you are not doing well or get worse. Document Released: 09/09/2010 Document Revised: 04/09/2013 Document Reviewed: 02/06/2013 [facility] Patient Information 2014 [place], [place].  Chest Wall Pain Chest wall pain is pain felt in or around the chest bones and muscles. It may take up to 6 weeks to get better. It  may take longer if you are active. Chest wall pain can happen on its own. Other times, things like germs, injury, coughing, or exercise can cause the pain. HOME CARE   Avoid activities that make you tired or cause pain. Try not to use your chest, belly (abdominal), or side muscles. Do not use heavy weights.  Put ice on the sore area.  Put ice  in a plastic bag.  Place a towel between your skin and the bag.  Leave the ice on for 15-20 minutes for the first 2 days.  Only take medicine as told by your doctor. GET HELP RIGHT AWAY IF:   You have more pain or are very uncomfortable.  You have a fever.  Your chest pain gets worse.  You have new problems.  You feel sick to your stomach (nauseous) or throw up (vomit).  You start to sweat or feel lightheaded.  You have a cough with mucus (phlegm).  You cough up blood. MAKE SURE YOU:   Understand these instructions.  Will watch your condition.  Will get help right away if you are not doing well or get worse. Document Released: 01/24/2008 Document Revised: 10/30/2011 Document Reviewed: 04/03/2011 [facility] Patient Information 2014 [place], [place].

## 2014-01-19 NOTE — Progress Notes (Signed)
   Patient name: Donna Sweeney MRN: [id number] DOB: 05/08/1946 Sex: female     Chief Complaint  Patient presents with  . Re-evaluation    6 wk f/u s/p AVF    HISTORY OF PRESENT ILLNESS: The patient is here today for followup.  She is status post left arm first stage basilic vein transposition on 12/05/2013.  She has no complaints.  She denies symptoms of steal syndrome.  Past Medical History  Diagnosis Date  . Hypertension   . CHF (congestive heart failure)   . Constipation   . Hypothyroidism   . GERD (gastroesophageal reflux disease)   . Hyperlipidemia   . Hx of seasonal allergies   . Fibromyalgia   . Arthritis   . Angina at rest   . Asthma   . Diabetes mellitus   . History of cardiac cath 2013,2005,1999    non obstructive CAD (done in [place]) EF 55%  . Herniated cervical disc   . Sciatica   . Diverticulitis   . Ulcerative colitis   . CAD (coronary artery disease)     non obstructive disease  . Biventricular heart failure   . Echocardiogram abnormal 10/17/12    EF 60-65%, grade I diastolic dysfunction, trivial MR, severely dilated LA  . Myocardial infarction   . Peripheral vascular disease   . Anemia   . Chronic kidney disease     stage 4 chronic kidney disease, lasr saw dr goldsbororugh  . Achilles tendon rupture 07-01-2013    right, wears boot to knee  . Bruises easily     both legs  . OSA (obstructive sleep apnea)     cpap setting of 9  . DVT (deep venous thrombosis)   . Heart murmur   . H/O hiatal hernia     Past Surgical History  Procedure Laterality Date  . Tonsillectomy    . Dilation and curettage of uterus    . Cholecystectomy    . Oophorectomy    . Cystectomy    . Knee arthroscopy    . Replacement total knee      right  . Foot surgery      right  . Bunionectomy      right  . Hammer toe surgery      right  . Mole removal      right 5th toe  . Foot surgery      joint removed from great toe and pin inserted  . Cardiac catheterization   2013, 2005, 1999    non obstructive CAD  . Abdominal hysterectomy    . Revision total knee arthroplasty Right 2009    had hematoma afterwards  . Esophagogastroduodenoscopy (egd) with propofol N/A 09/04/2013    Procedure: ESOPHAGOGASTRODUODENOSCOPY (EGD) WITH PROPOFOL;  Surgeon: Vincent C. Schooler, MD;  Location: [facility];  Service: Endoscopy;  Laterality: N/A;  . Colonoscopy with propofol N/A 09/04/2013    Procedure: COLONOSCOPY WITH PROPOFOL;  Surgeon: Vincent C. Schooler, MD;  Location: [facility];  Service: Endoscopy;  Laterality: N/A;  . Bascilic vein transposition Left 12/05/2013    Procedure: First Stage Bascilic Vein Transpostion;  Surgeon: Khaleah Duer W Fermin Yan, MD;  Location: [facility];  Service: Vascular;  Laterality: Left;    History   Social History  . Marital Status: Divorced    Spouse Name: N/A    Number of Children: N/A  . Years of Education: N/A   Occupational History  . Not on file.   Social History Main Topics  . Smoking   status: Never Smoker   . Smokeless tobacco: Never Used  . Alcohol Use: No  . Drug Use: No  . Sexual Activity: No   Other Topics Concern  . Not on file   Social History Narrative  . No narrative on file    Family History  Problem Relation Age of Onset  . Diabetes type II Mother   . Hypertension Mother   . Hyperlipidemia Mother   . Kidney disease Mother   . Fibroids Mother   . Diabetes Mother   . Heart disease Mother   . Varicose Veins Mother   . Peripheral vascular disease Mother   . Hypertension Sister   . Hypertension Brother   . Hypertension Sister   . Hypertension Sister     Allergies as of 01/19/2014 - Review Complete 01/19/2014  Allergen Reaction Noted  . Ivp dye [iodinated diagnostic agents] Shortness Of Breath, Itching, and Swelling 05/26/2012  . Raspberry Shortness Of Breath, Itching, and Swelling 05/26/2012    Current Outpatient Prescriptions on File Prior to Visit  Medication Sig Dispense Refill  . albuterol  (PROVENTIL HFA;VENTOLIN HFA) 108 (90 BASE) MCG/ACT inhaler Inhale 2 puffs into the lungs 2 (two) times daily as needed for shortness of breath. For shortness of breath      . aspirin 81 MG chewable tablet Chew 81 mg by mouth every morning.       . calcium-vitamin D (OSCAL WITH D) 500-200 MG-UNIT per tablet Take 1 tablet by mouth 2 (two) times daily.      . carvedilol (COREG) 25 MG tablet Take 37.5-50 mg by mouth 2 (two) times daily with a meal. Take 2 tablets in the morning and 1.5 tablets in the evening      . cholecalciferol (VITAMIN D) 1000 UNITS tablet Take 1,000 Units by mouth daily.      . cycloSPORINE (RESTASIS) 0.05 % ophthalmic emulsion Place 1 drop into both eyes 2 (two) times daily.      . diphenhydrAMINE (BENADRYL) 25 MG tablet Take 25 mg by mouth every other day.      . EPINEPHrine 0.3 mg/0.3 mL IJ SOAJ injection Inject 0.3 mg into the muscle once as needed (severe allergic reaction).      . fish oil-omega-3 fatty acids 1000 MG capsule Take 1 g by mouth 3 (three) times daily.      . fluticasone (FLONASE) 50 MCG/ACT nasal spray Place 2 sprays into the nose daily.      . furosemide (LASIX) 80 MG tablet Take 40-80 mg by mouth 2 (two) times daily. Take one tablet in the morning and half tablet at lunch time      . gabapentin (NEURONTIN) 300 MG capsule Take 300 mg by mouth 3 (three) times daily.      . hydrALAZINE (APRESOLINE) 50 MG tablet Take 50 mg by mouth 3 (three) times daily.      . lactulose, encephalopathy, (GENERLAC) 10 GM/15ML SOLN Take 10 g by mouth 2 (two) times daily.       . levothyroxine (SYNTHROID, LEVOTHROID) 100 MCG tablet Take 100 mcg by mouth daily before breakfast.       . nitroGLYCERIN (NITROSTAT) 0.4 MG SL tablet Place 0.4 mg under the tongue every 5 (five) minutes as needed for chest pain. For chest pain      . NOVOLIN N RELION 100 UNIT/ML injection Inject 20 Units into the skin 2 (two) times daily before a meal.       . Oxycodone HCl 10 MG   TABS Take 10 mg by mouth 2  (two) times daily.      . pantoprazole (PROTONIX) 20 MG tablet Take 20 mg by mouth 2 (two) times daily.      . potassium chloride (K-DUR) 10 MEQ tablet Take 20 mEq by mouth daily.       . simvastatin (ZOCOR) 40 MG tablet Take 40 mg by mouth every evening.      . SYMBICORT 80-4.5 MCG/ACT inhaler Inhale 2 puffs into the lungs 2 (two) times daily.      . tiZANidine (ZANAFLEX) 2 MG tablet Take 2 mg by mouth 3 (three) times daily with meals.      . traZODone (DESYREL) 50 MG tablet Take 50 mg by mouth at bedtime.       . valsartan (DIOVAN) 320 MG tablet Take 320 mg by mouth at bedtime.      . VICTOZA 18 MG/3ML SOPN Inject 1.2 Units into the skin every morning.       . DULoxetine (CYMBALTA) 60 MG capsule Take 60 mg by mouth every evening.       . fexofenadine (ALLEGRA) 180 MG tablet Take 180 mg by mouth every other day.      . HYDROmorphone (DILAUDID) 2 MG tablet Take 1 tablet (2 mg total) by mouth every 4 (four) hours as needed for severe pain.  20 tablet  0  . insulin glargine (LANTUS) 100 UNIT/ML injection Inject 40 Units into the skin at bedtime.       . predniSONE (DELTASONE) 20 MG tablet Take 3 tablets (60 mg total) by mouth daily.  15 tablet  0   No current facility-administered medications on file prior to visit.     REVIEW OF SYSTEMS: No changes  PHYSICAL EXAMINATION:   Vital signs are BP 157/68  Pulse 57  Ht 5' 8" (1.727 m)  Wt 271 lb (122.925 kg)  BMI 41.22 kg/m2  SpO2 95% General: The patient appears their stated age. HEENT:  No gross abnormalities Pulmonary:  Non labored breathing Neurologic: No focal weakness or paresthesias are detected, Skin: There are no ulcer or rashes noted. Psychiatric: The patient has normal affect. Cardiovascular: Palpable thrill within left arm fistula   Diagnostic Studies ultrasound shows an excellent caliber left basilic vein with diameter measures greater than 0.6 cm  Assessment: End-stage renal disease Plan: The patient will be  scheduled for second stage basilic vein transposition on 01/29/2014.  Risks and benefits were discussed with the patient.  All questions were answered  V. Wells Demareon Coldwell I V, M.D. [facility] Office: [phone] Pager:  [phone]    

## 2014-01-19 NOTE — ED Provider Notes (Signed)
CSN: [id number]     Arrival date & time 01/19/14  0124 History   First MD Initiated Contact with Patient 01/19/14 0201     Chief Complaint  Patient presents with  . Chest Pain     (Consider location/radiation/quality/duration/timing/severity/associated sxs/prior Treatment) HPI 68 yo female presents to the ER from home via EMS with complaint of left sided chest pain, shoulder pain.  Pain started tonight just prior to arrival.  She took her ntg without improvement.  She called 911 who recommended taking full strength aspirin.  She reports by the time EMS arrived pain was nearly gone.  Pain was not associated with shortness of breath, nausea, diaphoresis.  She did have some radiation into her jaw like a toothache.  Pt reports she had cath done in 2013 that only showed athosclerosis.  No prior stents.  Pt reports MI seen on ekg in past.  No fevers, chills, n/v/d.   Past Medical History  Diagnosis Date  . Hypertension   . CHF (congestive heart failure)   . Constipation   . Hypothyroidism   . GERD (gastroesophageal reflux disease)   . Hyperlipidemia   . Hx of seasonal allergies   . Fibromyalgia   . Arthritis   . Angina at rest   . Asthma   . Diabetes mellitus   . History of cardiac cath [id number]    non obstructive CAD (done in [place]) EF 55%  . Herniated cervical disc   . Sciatica   . Diverticulitis   . Ulcerative colitis   . CAD (coronary artery disease)     non obstructive disease  . Biventricular heart failure   . Echocardiogram abnormal 10/17/12    EF 123456, grade I diastolic dysfunction, trivial MR, severely dilated LA  . Myocardial infarction   . Peripheral vascular disease   . Anemia   . Chronic kidney disease     stage 4 chronic kidney disease, lasr saw dr Debroah Baller  . Achilles tendon rupture 07-01-2013    right, wears boot to knee  . Bruises easily     both legs  . OSA (obstructive sleep apnea)     cpap setting of 9  . DVT (deep venous thrombosis)   . Heart  murmur   . H/O hiatal hernia    Past Surgical History  Procedure Laterality Date  . Tonsillectomy    . Dilation and curettage of uterus    . Cholecystectomy    . Oophorectomy    . Cystectomy    . Knee arthroscopy    . Replacement total knee      right  . Foot surgery      right  . Bunionectomy      right  . Hammer toe surgery      right  . Mole removal      right 5th toe  . Foot surgery      joint removed from great toe and pin inserted  . Cardiac catheterization  2013, 2005, 1999    non obstructive CAD  . Abdominal hysterectomy    . Revision total knee arthroplasty Right 2009    had hematoma afterwards  . Esophagogastroduodenoscopy (egd) with propofol N/A 09/04/2013    Procedure: ESOPHAGOGASTRODUODENOSCOPY (EGD) WITH PROPOFOL;  Surgeon: Lear Ng, MD;  Location: [facility];  Service: Endoscopy;  Laterality: N/A;  . Colonoscopy with propofol N/A 09/04/2013    Procedure: COLONOSCOPY WITH PROPOFOL;  Surgeon: Lear Ng, MD;  Location: [facility];  Service: Endoscopy;  Laterality:  N/A;  . Bascilic vein transposition Left 12/05/2013    Procedure: First Stage Bascilic Vein Transpostion;  Surgeon: Serafina Mitchell, MD;  Location: [facility] OR;  Service: Vascular;  Laterality: Left;   Family History  Problem Relation Age of Onset  . Diabetes type II Mother   . Hypertension Mother   . Hyperlipidemia Mother   . Kidney disease Mother   . Fibroids Mother   . Diabetes Mother   . Heart disease Mother   . Varicose Veins Mother   . Peripheral vascular disease Mother   . Hypertension Sister   . Hypertension Brother   . Hypertension Sister   . Hypertension Sister    History  Substance Use Topics  . Smoking status: Never Smoker   . Smokeless tobacco: Never Used  . Alcohol Use: No   OB History   Grav Para Term Preterm Abortions TAB SAB Ect Mult Living   1    1   1        Review of Systems   See History of Present Illness; otherwise all other systems are  reviewed and negative  Allergies  Ivp dye and Raspberry  Home Medications   Prior to Admission medications   Medication Sig Start Date End Date Taking? Authorizing Provider  albuterol (PROVENTIL HFA;VENTOLIN HFA) 108 (90 BASE) MCG/ACT inhaler Inhale 2 puffs into the lungs 2 (two) times daily as needed for shortness of breath. For shortness of breath   Yes Historical Provider, MD  aspirin 81 MG chewable tablet Chew 81 mg by mouth every morning.    Yes Historical Provider, MD  calcium-vitamin D (OSCAL WITH D) 500-200 MG-UNIT per tablet Take 1 tablet by mouth 2 (two) times daily.   Yes Historical Provider, MD  carvedilol (COREG) 25 MG tablet Take 37.5-50 mg by mouth 2 (two) times daily with a meal. Take 2 tablets in the morning and 1.5 tablets in the evening   Yes Historical Provider, MD  cholecalciferol (VITAMIN D) 1000 UNITS tablet Take 1,000 Units by mouth daily.   Yes Historical Provider, MD  cycloSPORINE (RESTASIS) 0.05 % ophthalmic emulsion Place 1 drop into both eyes 2 (two) times daily.   Yes Historical Provider, MD  diphenhydrAMINE (BENADRYL) 25 MG tablet Take 25 mg by mouth every other day.   Yes Historical Provider, MD  DULoxetine (CYMBALTA) 60 MG capsule Take 60 mg by mouth every evening.    Yes Historical Provider, MD  EPINEPHrine 0.3 mg/0.3 mL IJ SOAJ injection Inject 0.3 mg into the muscle once as needed (severe allergic reaction).   Yes Historical Provider, MD  fexofenadine (ALLEGRA) 180 MG tablet Take 180 mg by mouth every other day.   Yes Historical Provider, MD  fish oil-omega-3 fatty acids 1000 MG capsule Take 1 g by mouth 3 (three) times daily.   Yes Historical Provider, MD  fluticasone (FLONASE) 50 MCG/ACT nasal spray Place 2 sprays into the nose daily.   Yes Historical Provider, MD  furosemide (LASIX) 80 MG tablet Take 40-80 mg by mouth 2 (two) times daily. Take one tablet in the morning and half tablet at lunch time   Yes Historical Provider, MD  gabapentin (NEURONTIN) 300  MG capsule Take 300 mg by mouth 3 (three) times daily.   Yes Historical Provider, MD  hydrALAZINE (APRESOLINE) 50 MG tablet Take 50 mg by mouth 3 (three) times daily.   Yes Historical Provider, MD  HYDROmorphone (DILAUDID) 2 MG tablet Take 1 tablet (2 mg total) by mouth every 4 (four) hours  as needed for severe pain. 12/24/13  Yes Kalman Drape, MD  insulin glargine (LANTUS) 100 UNIT/ML injection Inject 40 Units into the skin at bedtime.    Yes Historical Provider, MD  lactulose, encephalopathy, (GENERLAC) 10 GM/15ML SOLN Take 10 g by mouth 2 (two) times daily.    Yes Historical Provider, MD  levothyroxine (SYNTHROID, LEVOTHROID) 100 MCG tablet Take 100 mcg by mouth daily before breakfast.    Yes Historical Provider, MD  nitroGLYCERIN (NITROSTAT) 0.4 MG SL tablet Place 0.4 mg under the tongue every 5 (five) minutes as needed for chest pain. For chest pain   Yes Historical Provider, MD  NOVOLIN N RELION 100 UNIT/ML injection Inject 20 Units into the skin 2 (two) times daily before a meal.  12/18/13  Yes Historical Provider, MD  Oxycodone HCl 10 MG TABS Take 10 mg by mouth 2 (two) times daily.   Yes Historical Provider, MD  pantoprazole (PROTONIX) 20 MG tablet Take 20 mg by mouth 2 (two) times daily.   Yes Historical Provider, MD  potassium chloride (K-DUR) 10 MEQ tablet Take 10 mEq by mouth daily.  12/22/13  Yes Historical Provider, MD  predniSONE (DELTASONE) 20 MG tablet Take 3 tablets (60 mg total) by mouth daily. 12/24/13  Yes Kalman Drape, MD  simvastatin (ZOCOR) 40 MG tablet Take 40 mg by mouth every evening.   Yes Historical Provider, MD  SYMBICORT 80-4.5 MCG/ACT inhaler Inhale 2 puffs into the lungs 2 (two) times daily. 10/28/13  Yes Historical Provider, MD  tiZANidine (ZANAFLEX) 2 MG tablet Take 2 mg by mouth 3 (three) times daily with meals. 10/28/13  Yes Historical Provider, MD  traZODone (DESYREL) 50 MG tablet Take 50 mg by mouth at bedtime.    Yes Historical Provider, MD  valsartan (DIOVAN) 320 MG  tablet Take 320 mg by mouth at bedtime.   Yes Historical Provider, MD  VICTOZA 18 MG/3ML SOPN Inject 1.2 Units into the skin every morning.  05/23/13  Yes Historical Provider, MD   BP 154/67  Pulse 48  Temp(Src) 97.7 F (36.5 C) (Oral)  Resp 15  Ht 5\' 8"  (1.727 m)  Wt 260 lb (117.935 kg)  BMI 39.54 kg/m2  SpO2 95% Physical Exam  Nursing note and vitals reviewed. Constitutional: She is oriented to person, place, and time. She appears well-developed and well-nourished.  HENT:  Head: Normocephalic and atraumatic.  Right Ear: External ear normal.  Left Ear: External ear normal.  Nose: Nose normal.  Mouth/Throat: Oropharynx is clear and moist.  Eyes: Conjunctivae and EOM are normal. Pupils are equal, round, and reactive to light.  Neck: Normal range of motion. Neck supple. No JVD present. No tracheal deviation present. No thyromegaly present.  Cardiovascular: Normal rate, regular rhythm, normal heart sounds and intact distal pulses.  Exam reveals no gallop and no friction rub.   No murmur heard. Pulmonary/Chest: Effort normal and breath sounds normal. No stridor. No respiratory distress. She has no wheezes. She has no rales. She exhibits tenderness (patient has reproducible chest pain with palpation of left upper chest and shoulder.  Palpation of this area reproduces her chest pain completely).  Abdominal: Soft. Bowel sounds are normal. She exhibits no distension and no mass. There is no tenderness. There is no rebound and no guarding.  Musculoskeletal: She exhibits edema. She exhibits no tenderness.  Patient has decreased range of motion of her lower sternum secondary to pain  Lymphadenopathy:    She has no cervical adenopathy.  Neurological: She is  alert and oriented to person, place, and time. She exhibits normal muscle tone. Coordination normal.  Skin: Skin is warm and dry. No rash noted. No erythema. No pallor.  Psychiatric: She has a normal mood and affect. Her behavior is normal.  Judgment and thought content normal.    ED Course  Procedures (including critical care time) Labs Review Labs Reviewed  CBC WITH DIFFERENTIAL - Abnormal; Notable for the following:    RBC 3.45 (*)    Hemoglobin 10.4 (*)    HCT 32.2 (*)    Neutrophils Relative % 40 (*)    All other components within normal limits  COMPREHENSIVE METABOLIC PANEL - Abnormal; Notable for the following:    Potassium 3.5 (*)    BUN 49 (*)    Creatinine, Ser 2.72 (*)    Albumin 3.4 (*)    GFR calc non Af Amer 17 (*)    GFR calc Af Amer 20 (*)    All other components within normal limits  TROPONIN I  I-STAT TROPOININ, ED    Imaging Review Dg Chest 2 View  01/19/2014   CLINICAL DATA:  Mid chest pain  EXAM: CHEST  2 VIEW  COMPARISON:  12/02/2013.  FINDINGS: Cardiomegaly and aortic tortuosity which is stable from prior. No consolidation, edema, effusion, or pneumothorax. Cholecystectomy changes.  IMPRESSION: Mild cardiomegaly.  No edema or pneumonia.   Electronically Signed   By: Jorje Guild M.D.   On: 01/19/2014 02:42     EKG Interpretation   Date/Time:  Monday January 19 2014 02:31:36 EDT Ventricular Rate:  59 PR Interval:  233 QRS Duration: 109 QT Interval:  485 QTC Calculation: 480 R Axis:   1 Text Interpretation:  Sinus rhythm Prolonged PR interval LVH with  secondary repolarization abnormality Confirmed by Iysha Mishkin  MD, Orian Amberg ([id number])  on 01/19/2014 2:53:31 AM      MDM   Final diagnoses:  Anterior chest wall pain   68 year old female with multiple medical problems who presents with chest pain.  Pain appears to be musculoskeletal as it is reproducible.  EKG does not show any ischemic changes.  Patient reports negative catheterization within the last 2 years.  Will treat with morphine for pain, and to a delta troponin.  5:26 AM Negative delta troponin.  Pain improved.    Kalman Drape, MD 01/19/14 [id number]

## 2014-01-22 NOTE — ED Provider Notes (Signed)
Medical screening examination/treatment/procedure(s) were performed by non-physician practitioner and as supervising physician I was immediately available for consultation/collaboration.   EKG Interpretation None       Virgel Manifold, MD 01/22/14 [phone]

## 2014-01-28 ENCOUNTER — Encounter (HOSPITAL_COMMUNITY): Payer: Self-pay | Admitting: *Deleted

## 2014-01-28 MED ORDER — CHLORHEXIDINE GLUCONATE 4 % EX LIQD
60.0000 mL | Freq: Once | CUTANEOUS | Status: DC
  Filled 2014-01-28: qty 60

## 2014-01-28 MED ORDER — SODIUM CHLORIDE 0.9 % IV SOLN
INTRAVENOUS | Status: DC
  Administered 2014-01-29: 07:00:00 via INTRAVENOUS

## 2014-01-28 MED ORDER — SODIUM CHLORIDE 0.9 % IV SOLN: INTRAVENOUS | Status: DC

## 2014-01-28 MED ORDER — DEXTROSE 5 % IV SOLN
1.5000 g | INTRAVENOUS | Status: AC
  Administered 2014-01-29: 1.5 g via INTRAVENOUS
  Filled 2014-01-28: qty 1.5

## 2014-01-29 ENCOUNTER — Encounter (HOSPITAL_COMMUNITY): Payer: Self-pay | Admitting: Certified Registered Nurse Anesthetist

## 2014-01-29 ENCOUNTER — Inpatient Hospital Stay (HOSPITAL_COMMUNITY)
Admission: RE | Admit: 2014-01-29 | Discharge: 2014-01-31 | DRG: 675 | Disposition: A | Discharge status: Home / Self Care | Payer: Medicare Other | Source: Ambulatory Visit | Attending: Surgery | Admitting: Surgery

## 2014-01-29 ENCOUNTER — Ambulatory Visit (HOSPITAL_COMMUNITY): Payer: Medicare Other | Admitting: Certified Registered Nurse Anesthetist

## 2014-01-29 ENCOUNTER — Encounter (HOSPITAL_COMMUNITY): Payer: Medicare Other | Admitting: Certified Registered Nurse Anesthetist

## 2014-01-29 DIAGNOSIS — IMO0002 Reserved for concepts with insufficient information to code with codable children: Secondary | ICD-10-CM

## 2014-01-29 DIAGNOSIS — Z833 Family history of diabetes mellitus: Secondary | ICD-10-CM

## 2014-01-29 DIAGNOSIS — Z86718 Personal history of other venous thrombosis and embolism: Secondary | ICD-10-CM

## 2014-01-29 DIAGNOSIS — I739 Peripheral vascular disease, unspecified: Secondary | ICD-10-CM | POA: Diagnosis present

## 2014-01-29 DIAGNOSIS — IMO0001 Reserved for inherently not codable concepts without codable children: Secondary | ICD-10-CM | POA: Diagnosis present

## 2014-01-29 DIAGNOSIS — J45909 Unspecified asthma, uncomplicated: Secondary | ICD-10-CM | POA: Diagnosis present

## 2014-01-29 DIAGNOSIS — E785 Hyperlipidemia, unspecified: Secondary | ICD-10-CM | POA: Diagnosis present

## 2014-01-29 DIAGNOSIS — I509 Heart failure, unspecified: Secondary | ICD-10-CM | POA: Diagnosis present

## 2014-01-29 DIAGNOSIS — D649 Anemia, unspecified: Secondary | ICD-10-CM | POA: Diagnosis present

## 2014-01-29 DIAGNOSIS — I120 Hypertensive chronic kidney disease with stage 5 chronic kidney disease or end stage renal disease: Secondary | ICD-10-CM | POA: Diagnosis present

## 2014-01-29 DIAGNOSIS — G4733 Obstructive sleep apnea (adult) (pediatric): Secondary | ICD-10-CM | POA: Diagnosis present

## 2014-01-29 DIAGNOSIS — K5900 Constipation, unspecified: Secondary | ICD-10-CM | POA: Diagnosis present

## 2014-01-29 DIAGNOSIS — K219 Gastro-esophageal reflux disease without esophagitis: Secondary | ICD-10-CM | POA: Diagnosis present

## 2014-01-29 DIAGNOSIS — N186 End stage renal disease: Principal | ICD-10-CM | POA: Diagnosis present

## 2014-01-29 DIAGNOSIS — E039 Hypothyroidism, unspecified: Secondary | ICD-10-CM | POA: Diagnosis present

## 2014-01-29 DIAGNOSIS — Z8249 Family history of ischemic heart disease and other diseases of the circulatory system: Secondary | ICD-10-CM

## 2014-01-29 DIAGNOSIS — Z7982 Long term (current) use of aspirin: Secondary | ICD-10-CM

## 2014-01-29 DIAGNOSIS — Z96659 Presence of unspecified artificial knee joint: Secondary | ICD-10-CM

## 2014-01-29 DIAGNOSIS — Z794 Long term (current) use of insulin: Secondary | ICD-10-CM

## 2014-01-29 DIAGNOSIS — I2510 Atherosclerotic heart disease of native coronary artery without angina pectoris: Secondary | ICD-10-CM | POA: Diagnosis present

## 2014-01-29 DIAGNOSIS — I252 Old myocardial infarction: Secondary | ICD-10-CM

## 2014-01-29 DIAGNOSIS — M129 Arthropathy, unspecified: Secondary | ICD-10-CM | POA: Diagnosis present

## 2014-01-29 DIAGNOSIS — E119 Type 2 diabetes mellitus without complications: Secondary | ICD-10-CM | POA: Diagnosis present

## 2014-01-29 LAB — POCT I-STAT 4, (NA,K, GLUC, HGB,HCT)
Glucose, Bld: 106 mg/dL — ABNORMAL HIGH (ref 70–99)
HEMATOCRIT: 32 % — AB (ref 36.0–46.0)
Hemoglobin: 10.9 g/dL — ABNORMAL LOW (ref 12.0–15.0)
Potassium: 3.4 mEq/L — ABNORMAL LOW (ref 3.7–5.3)
Sodium: 140 mEq/L (ref 137–147)

## 2014-01-29 LAB — GLUCOSE, CAPILLARY
GLUCOSE-CAPILLARY: 133 mg/dL — AB (ref 70–99)
Glucose-Capillary: 123 mg/dL — ABNORMAL HIGH (ref 70–99)
Glucose-Capillary: 99 mg/dL (ref 70–99)
Glucose-Capillary: 140 mg/dL — ABNORMAL HIGH (ref 70–99)

## 2014-01-29 LAB — CREATININE, SERUM
CREATININE: 2.46 mg/dL — AB (ref 0.50–1.10)
GFR calc Af Amer: 22 mL/min — ABNORMAL LOW (ref >90)
GFR calc non Af Amer: 19 mL/min — ABNORMAL LOW (ref >90)

## 2014-01-29 LAB — CBC
HCT: 34.4 % — ABNORMAL LOW (ref 36.0–46.0)
Hemoglobin: 11 g/dL — ABNORMAL LOW (ref 12.0–15.0)
MCH: 29.4 pg (ref 26.0–34.0)
MCHC: 32 g/dL (ref 30.0–36.0)
MCV: 92 fL (ref 78.0–100.0)
PLATELETS: 241 10*3/uL (ref 150–400)
RBC: 3.74 MIL/uL — ABNORMAL LOW (ref 3.87–5.11)
RDW: 13.6 % (ref 11.5–15.5)
WBC: 12.2 10*3/uL — ABNORMAL HIGH (ref 4.0–10.5)

## 2014-01-29 MED ORDER — ALBUTEROL SULFATE (2.5 MG/3ML) 0.083% IN NEBU: 2.5000 mg | INHALATION_SOLUTION | Freq: Two times a day (BID) | RESPIRATORY_TRACT | Status: DC | PRN

## 2014-01-29 MED ORDER — OXYCODONE-ACETAMINOPHEN 5-325 MG PO TABS
1.0000 | ORAL_TABLET | Freq: Four times a day (QID) | ORAL | Status: DC | PRN
  Administered 2014-01-30 (×2): 1 via ORAL
  Filled 2014-01-29 (×2): qty 1

## 2014-01-29 MED ORDER — ALBUMIN HUMAN 5 % IV SOLN
INTRAVENOUS | Status: DC | PRN
  Administered 2014-01-29: 09:00:00 via INTRAVENOUS

## 2014-01-29 MED ORDER — POTASSIUM CHLORIDE ER 10 MEQ PO TBCR
20.0000 meq | EXTENDED_RELEASE_TABLET | Freq: Every day | ORAL | Status: DC
  Administered 2014-01-29: 20 meq via ORAL
  Filled 2014-01-29 (×3): qty 2

## 2014-01-29 MED ORDER — EPHEDRINE SULFATE 50 MG/ML IJ SOLN
INTRAMUSCULAR | Status: DC | PRN
  Administered 2014-01-29 (×3): 10 mg via INTRAVENOUS
  Administered 2014-01-29 (×2): 5 mg via INTRAVENOUS
  Administered 2014-01-29: 10 mg via INTRAVENOUS

## 2014-01-29 MED ORDER — MIDAZOLAM HCL 5 MG/5ML IJ SOLN
INTRAMUSCULAR | Status: DC | PRN
Start: 2014-01-29 — End: 2014-01-29
  Administered 2014-01-29: 1 mg via INTRAVENOUS

## 2014-01-29 MED ORDER — DULOXETINE HCL 60 MG PO CPEP
60.0000 mg | ORAL_CAPSULE | Freq: Every evening | ORAL | Status: DC
  Administered 2014-01-29 – 2014-01-30 (×2): 60 mg via ORAL
  Filled 2014-01-29 (×4): qty 1

## 2014-01-29 MED ORDER — ALBUTEROL SULFATE HFA 108 (90 BASE) MCG/ACT IN AERS
INHALATION_SPRAY | RESPIRATORY_TRACT | Status: DC | PRN
  Administered 2014-01-29: 6 via RESPIRATORY_TRACT

## 2014-01-29 MED ORDER — INSULIN NPH (HUMAN) (ISOPHANE) 100 UNIT/ML ~~LOC~~ SUSP: 20.0000 [IU] | Freq: Two times a day (BID) | SUBCUTANEOUS | Status: DC

## 2014-01-29 MED ORDER — MIDAZOLAM HCL 2 MG/2ML IJ SOLN
INTRAMUSCULAR | Status: AC
  Filled 2014-01-29: qty 2

## 2014-01-29 MED ORDER — MENTHOL 3 MG MT LOZG
1.0000 | LOZENGE | OROMUCOSAL | Status: DC | PRN
  Filled 2014-01-29: qty 9

## 2014-01-29 MED ORDER — CYCLOSPORINE 0.05 % OP EMUL
1.0000 [drp] | Freq: Two times a day (BID) | OPHTHALMIC | Status: DC
  Administered 2014-01-29 – 2014-01-30 (×2): 1 [drp] via OPHTHALMIC
  Filled 2014-01-29 (×7): qty 1

## 2014-01-29 MED ORDER — HYDRALAZINE HCL 50 MG PO TABS
50.0000 mg | ORAL_TABLET | Freq: Three times a day (TID) | ORAL | Status: DC
  Administered 2014-01-29 – 2014-01-30 (×3): 50 mg via ORAL
  Filled 2014-01-29 (×9): qty 1

## 2014-01-29 MED ORDER — GUAIFENESIN-DM 100-10 MG/5ML PO SYRP: 15.0000 mL | ORAL_SOLUTION | ORAL | Status: DC | PRN

## 2014-01-29 MED ORDER — PROPOFOL 10 MG/ML IV BOLUS
INTRAVENOUS | Status: AC
  Filled 2014-01-29: qty 20

## 2014-01-29 MED ORDER — ASPIRIN 81 MG PO CHEW: 81.0000 mg | CHEWABLE_TABLET | Freq: Every morning | ORAL | Status: DC

## 2014-01-29 MED ORDER — FENTANYL CITRATE 0.05 MG/ML IJ SOLN
INTRAMUSCULAR | Status: AC
  Filled 2014-01-29: qty 5

## 2014-01-29 MED ORDER — SIMVASTATIN 40 MG PO TABS
40.0000 mg | ORAL_TABLET | Freq: Every evening | ORAL | Status: DC
  Administered 2014-01-29 – 2014-01-30 (×2): 40 mg via ORAL
  Filled 2014-01-29 (×4): qty 1

## 2014-01-29 MED ORDER — HYDROMORPHONE HCL PF 1 MG/ML IJ SOLN
0.2500 mg | INTRAMUSCULAR | Status: DC | PRN
Start: 2014-01-29 — End: 2014-01-29

## 2014-01-29 MED ORDER — SODIUM CHLORIDE 0.9 % IV SOLN: INTRAVENOUS | Status: DC

## 2014-01-29 MED ORDER — FENTANYL CITRATE 0.05 MG/ML IJ SOLN
INTRAMUSCULAR | Status: DC | PRN
  Administered 2014-01-29: 125 ug via INTRAVENOUS

## 2014-01-29 MED ORDER — TRAZODONE HCL 50 MG PO TABS
50.0000 mg | ORAL_TABLET | Freq: Every day | ORAL | Status: DC
  Administered 2014-01-29 – 2014-01-30 (×2): 50 mg via ORAL
  Filled 2014-01-29 (×4): qty 1

## 2014-01-29 MED ORDER — METOPROLOL TARTRATE 1 MG/ML IV SOLN: 2.0000 mg | INTRAVENOUS | Status: DC | PRN

## 2014-01-29 MED ORDER — FUROSEMIDE 80 MG PO TABS
80.0000 mg | ORAL_TABLET | Freq: Every day | ORAL | Status: DC
  Administered 2014-01-31: 80 mg via ORAL
  Filled 2014-01-29 (×2): qty 2
  Filled 2014-01-29: qty 1

## 2014-01-29 MED ORDER — COLCHICINE 0.6 MG PO TABS
0.6000 mg | ORAL_TABLET | Freq: Two times a day (BID) | ORAL | Status: DC
  Administered 2014-01-29 – 2014-01-30 (×2): 0.6 mg via ORAL
  Filled 2014-01-29 (×6): qty 1

## 2014-01-29 MED ORDER — CALCIUM CARBONATE-VITAMIN D 500-200 MG-UNIT PO TABS
1.0000 | ORAL_TABLET | Freq: Two times a day (BID) | ORAL | Status: DC
  Administered 2014-01-29 – 2014-01-30 (×2): 1 via ORAL
  Filled 2014-01-29 (×6): qty 1

## 2014-01-29 MED ORDER — LIDOCAINE HCL (CARDIAC) 20 MG/ML IV SOLN
INTRAVENOUS | Status: AC
  Filled 2014-01-29: qty 5

## 2014-01-29 MED ORDER — PANTOPRAZOLE SODIUM 20 MG PO TBEC
20.0000 mg | DELAYED_RELEASE_TABLET | Freq: Two times a day (BID) | ORAL | Status: DC
  Administered 2014-01-29 – 2014-01-30 (×2): 20 mg via ORAL
  Filled 2014-01-29 (×7): qty 1

## 2014-01-29 MED ORDER — NEOSTIGMINE METHYLSULFATE 10 MG/10ML IV SOLN
INTRAVENOUS | Status: DC | PRN
  Administered 2014-01-29: 5 mg via INTRAVENOUS

## 2014-01-29 MED ORDER — FLUTICASONE PROPIONATE 50 MCG/ACT NA SUSP
2.0000 | Freq: Every day | NASAL | Status: DC
  Filled 2014-01-29: qty 16

## 2014-01-29 MED ORDER — INSULIN GLARGINE 100 UNIT/ML ~~LOC~~ SOLN
40.0000 [IU] | Freq: Every day | SUBCUTANEOUS | Status: DC
  Administered 2014-01-29 – 2014-01-30 (×2): 40 [IU] via SUBCUTANEOUS
  Filled 2014-01-29 (×3): qty 0.4

## 2014-01-29 MED ORDER — ENOXAPARIN SODIUM 30 MG/0.3ML ~~LOC~~ SOLN
30.0000 mg | SUBCUTANEOUS | Status: DC
  Administered 2014-01-29: 30 mg via SUBCUTANEOUS
  Filled 2014-01-29 (×3): qty 0.3

## 2014-01-29 MED ORDER — PHENOL 1.4 % MT LIQD
1.0000 | OROMUCOSAL | Status: DC | PRN
  Administered 2014-01-29 – 2014-01-30 (×2): 1 via OROMUCOSAL
  Filled 2014-01-29: qty 177

## 2014-01-29 MED ORDER — ACETAMINOPHEN 650 MG RE SUPP: 325.0000 mg | RECTAL | Status: DC | PRN

## 2014-01-29 MED ORDER — VITAMIN D3 25 MCG (1000 UNIT) PO TABS
1000.0000 [IU] | ORAL_TABLET | Freq: Every day | ORAL | Status: DC
  Filled 2014-01-29 (×3): qty 1

## 2014-01-29 MED ORDER — PROPOFOL 10 MG/ML IV BOLUS
INTRAVENOUS | Status: DC | PRN
  Administered 2014-01-29: 80 mg via INTRAVENOUS

## 2014-01-29 MED ORDER — ONDANSETRON HCL 4 MG/2ML IJ SOLN: 4.0000 mg | Freq: Four times a day (QID) | INTRAMUSCULAR | Status: DC | PRN

## 2014-01-29 MED ORDER — CALCIUM CHLORIDE 10 % IV SOLN
INTRAVENOUS | Status: DC | PRN
  Administered 2014-01-29: 300 mg via INTRAVENOUS

## 2014-01-29 MED ORDER — DIPHENHYDRAMINE HCL 25 MG PO TABS
25.0000 mg | ORAL_TABLET | ORAL | Status: DC
  Filled 2014-01-29 (×2): qty 1

## 2014-01-29 MED ORDER — HYDROMORPHONE HCL 2 MG PO TABS: 2.0000 mg | ORAL_TABLET | ORAL | Status: DC | PRN

## 2014-01-29 MED ORDER — ALBUTEROL SULFATE HFA 108 (90 BASE) MCG/ACT IN AERS: 2.0000 | INHALATION_SPRAY | Freq: Two times a day (BID) | RESPIRATORY_TRACT | Status: DC | PRN

## 2014-01-29 MED ORDER — PREDNISONE 10 MG PO TABS
60.0000 mg | ORAL_TABLET | Freq: Every day | ORAL | Status: DC
  Filled 2014-01-29 (×3): qty 1

## 2014-01-29 MED ORDER — CARVEDILOL 25 MG PO TABS
37.5000 mg | ORAL_TABLET | Freq: Every day | ORAL | Status: DC
  Administered 2014-01-30: 37.5 mg via ORAL
  Filled 2014-01-29 (×4): qty 1

## 2014-01-29 MED ORDER — GLYCOPYRROLATE 0.2 MG/ML IJ SOLN
INTRAMUSCULAR | Status: DC | PRN
  Administered 2014-01-29: .8 mg via INTRAVENOUS

## 2014-01-29 MED ORDER — LEVOTHYROXINE SODIUM 100 MCG PO TABS
100.0000 ug | ORAL_TABLET | Freq: Every day | ORAL | Status: DC
  Administered 2014-01-31: 100 ug via ORAL
  Filled 2014-01-29 (×3): qty 1

## 2014-01-29 MED ORDER — CARVEDILOL 25 MG PO TABS
50.0000 mg | ORAL_TABLET | Freq: Every day | ORAL | Status: DC
  Administered 2014-01-31: 50 mg via ORAL
  Filled 2014-01-29 (×3): qty 2

## 2014-01-29 MED ORDER — EPINEPHRINE 0.3 MG/0.3ML IJ SOAJ: 0.3000 mg | Freq: Once | INTRAMUSCULAR | Status: AC | PRN

## 2014-01-29 MED ORDER — GABAPENTIN 300 MG PO CAPS
300.0000 mg | ORAL_CAPSULE | Freq: Three times a day (TID) | ORAL | Status: DC
  Administered 2014-01-29 – 2014-01-30 (×3): 300 mg via ORAL
  Filled 2014-01-29 (×9): qty 1

## 2014-01-29 MED ORDER — LIRAGLUTIDE 18 MG/3ML ~~LOC~~ SOPN: 1.2000 [IU] | PEN_INJECTOR | Freq: Every morning | SUBCUTANEOUS | Status: DC

## 2014-01-29 MED ORDER — ROCURONIUM BROMIDE 100 MG/10ML IV SOLN
INTRAVENOUS | Status: DC | PRN
  Administered 2014-01-29: 40 mg via INTRAVENOUS

## 2014-01-29 MED ORDER — CARVEDILOL 25 MG PO TABS: 37.5000 mg | ORAL_TABLET | Freq: Two times a day (BID) | ORAL | Status: DC

## 2014-01-29 MED ORDER — FUROSEMIDE 40 MG PO TABS
40.0000 mg | ORAL_TABLET | Freq: Every day | ORAL | Status: DC
  Filled 2014-01-29 (×3): qty 1

## 2014-01-29 MED ORDER — BUDESONIDE-FORMOTEROL FUMARATE 80-4.5 MCG/ACT IN AERO
2.0000 | INHALATION_SPRAY | Freq: Two times a day (BID) | RESPIRATORY_TRACT | Status: DC
  Administered 2014-01-30: 2 via RESPIRATORY_TRACT
  Filled 2014-01-29: qty 6.9

## 2014-01-29 MED ORDER — OXYCODONE HCL ER 10 MG PO T12A
10.0000 mg | EXTENDED_RELEASE_TABLET | Freq: Two times a day (BID) | ORAL | Status: DC
  Administered 2014-01-29 – 2014-01-31 (×3): 10 mg via ORAL
  Filled 2014-01-29 (×3): qty 1

## 2014-01-29 MED ORDER — INSULIN ASPART 100 UNIT/ML ~~LOC~~ SOLN
0.0000 [IU] | Freq: Three times a day (TID) | SUBCUTANEOUS | Status: DC
  Administered 2014-01-29: 3 [IU] via SUBCUTANEOUS

## 2014-01-29 MED ORDER — TIZANIDINE HCL 2 MG PO TABS
2.0000 mg | ORAL_TABLET | Freq: Three times a day (TID) | ORAL | Status: DC
  Administered 2014-01-30 – 2014-01-31 (×2): 2 mg via ORAL
  Filled 2014-01-29 (×9): qty 1

## 2014-01-29 MED ORDER — NITROGLYCERIN 0.4 MG SL SUBL: 0.4000 mg | SUBLINGUAL_TABLET | SUBLINGUAL | Status: DC | PRN

## 2014-01-29 MED ORDER — LABETALOL HCL 5 MG/ML IV SOLN
10.0000 mg | INTRAVENOUS | Status: DC | PRN
  Filled 2014-01-29: qty 4

## 2014-01-29 MED ORDER — ACETAMINOPHEN 325 MG PO TABS: 325.0000 mg | ORAL_TABLET | ORAL | Status: DC | PRN

## 2014-01-29 MED ORDER — PHENYLEPHRINE HCL 10 MG/ML IJ SOLN
INTRAMUSCULAR | Status: DC | PRN
  Administered 2014-01-29 (×6): 80 ug via INTRAVENOUS

## 2014-01-29 MED ORDER — HYDRALAZINE HCL 20 MG/ML IJ SOLN: 10.0000 mg | INTRAMUSCULAR | Status: DC | PRN

## 2014-01-29 MED ORDER — VASOPRESSIN BOLUS VIA INFUSION
INTRAVENOUS | Status: DC | PRN
  Administered 2014-01-29 (×2): 1 [IU] via INTRAVENOUS

## 2014-01-29 MED ORDER — SODIUM CHLORIDE 0.9 % IV SOLN
INTRAVENOUS | Status: DC | PRN
  Administered 2014-01-29 (×2): via INTRAVENOUS

## 2014-01-29 MED ORDER — LACTULOSE 10 GM/15ML PO SOLN
10.0000 g | Freq: Two times a day (BID) | ORAL | Status: DC
  Administered 2014-01-29 – 2014-01-30 (×2): 10 g via ORAL
  Filled 2014-01-29 (×6): qty 15

## 2014-01-29 NOTE — Anesthesia Postprocedure Evaluation (Signed)
  Anesthesia Post-op Note  Patient: Donna Sweeney  Procedure(s) Performed: Procedure(s): CANCELLED PROCEDURE  Patient Location: PACU  Anesthesia Type:General  Level of Consciousness: awake  Airway and Oxygen Therapy: Patient Spontanous Breathing  Post-op Pain: mild  Post-op Assessment: Post-op Vital signs reviewed  Post-op Vital Signs: Reviewed  Last Vitals:  Filed Vitals:   01/29/14 0923  BP: 136/53  Pulse: 68  Temp:   Resp: 19    Complications: Case cancelled and rescheduled for tomorrow in am. Pacu stay uneventful

## 2014-01-29 NOTE — H&P (View-Only) (Signed)
Patient name: Donna Sweeney MRN: [id number] DOB: 02/26/1946 Sex: female     Chief Complaint  Patient presents with  . Re-evaluation    6 wk f/u s/p AVF    HISTORY OF PRESENT ILLNESS: The patient is here today for followup.  She is status post left arm first stage basilic vein transposition on 12/05/2013.  She has no complaints.  She denies symptoms of steal syndrome.  Past Medical History  Diagnosis Date  . Hypertension   . CHF (congestive heart failure)   . Constipation   . Hypothyroidism   . GERD (gastroesophageal reflux disease)   . Hyperlipidemia   . Hx of seasonal allergies   . Fibromyalgia   . Arthritis   . Angina at rest   . Asthma   . Diabetes mellitus   . History of cardiac cath [phone]    non obstructive CAD (done in [place]) EF 55%  . Herniated cervical disc   . Sciatica   . Diverticulitis   . Ulcerative colitis   . CAD (coronary artery disease)     non obstructive disease  . Biventricular heart failure   . Echocardiogram abnormal 10/17/12    EF 57-84%, grade I diastolic dysfunction, trivial MR, severely dilated LA  . Myocardial infarction   . Peripheral vascular disease   . Anemia   . Chronic kidney disease     stage 4 chronic kidney disease, lasr saw dr Debroah Baller  . Achilles tendon rupture 07-01-2013    right, wears boot to knee  . Bruises easily     both legs  . OSA (obstructive sleep apnea)     cpap setting of 9  . DVT (deep venous thrombosis)   . Heart murmur   . H/O hiatal hernia     Past Surgical History  Procedure Laterality Date  . Tonsillectomy    . Dilation and curettage of uterus    . Cholecystectomy    . Oophorectomy    . Cystectomy    . Knee arthroscopy    . Replacement total knee      right  . Foot surgery      right  . Bunionectomy      right  . Hammer toe surgery      right  . Mole removal      right 5th toe  . Foot surgery      joint removed from great toe and pin inserted  . Cardiac catheterization   2013, 2005, 1999    non obstructive CAD  . Abdominal hysterectomy    . Revision total knee arthroplasty Right 2009    had hematoma afterwards  . Esophagogastroduodenoscopy (egd) with propofol N/A 09/04/2013    Procedure: ESOPHAGOGASTRODUODENOSCOPY (EGD) WITH PROPOFOL;  Surgeon: Lear Ng, MD;  Location: [facility];  Service: Endoscopy;  Laterality: N/A;  . Colonoscopy with propofol N/A 09/04/2013    Procedure: COLONOSCOPY WITH PROPOFOL;  Surgeon: Lear Ng, MD;  Location: [facility];  Service: Endoscopy;  Laterality: N/A;  . Bascilic vein transposition Left 12/05/2013    Procedure: First Stage Bascilic Vein Transpostion;  Surgeon: Serafina Mitchell, MD;  Location: [place];  Service: Vascular;  Laterality: Left;    History   Social History  . Marital Status: Divorced    Spouse Name: N/A    Number of Children: N/A  . Years of Education: N/A   Occupational History  . Not on file.   Social History Main Topics  . Smoking  status: Never Smoker   . Smokeless tobacco: Never Used  . Alcohol Use: No  . Drug Use: No  . Sexual Activity: No   Other Topics Concern  . Not on file   Social History Narrative  . No narrative on file    Family History  Problem Relation Age of Onset  . Diabetes type II Mother   . Hypertension Mother   . Hyperlipidemia Mother   . Kidney disease Mother   . Fibroids Mother   . Diabetes Mother   . Heart disease Mother   . Varicose Veins Mother   . Peripheral vascular disease Mother   . Hypertension Sister   . Hypertension Brother   . Hypertension Sister   . Hypertension Sister     Allergies as of 01/19/2014 - Review Complete 01/19/2014  Allergen Reaction Noted  . Ivp dye [iodinated diagnostic agents] Shortness Of Breath, Itching, and Swelling 05/26/2012  . Raspberry Shortness Of Breath, Itching, and Swelling 05/26/2012    Current Outpatient Prescriptions on File Prior to Visit  Medication Sig Dispense Refill  . albuterol  (PROVENTIL HFA;VENTOLIN HFA) 108 (90 BASE) MCG/ACT inhaler Inhale 2 puffs into the lungs 2 (two) times daily as needed for shortness of breath. For shortness of breath      . aspirin 81 MG chewable tablet Chew 81 mg by mouth every morning.       . calcium-vitamin D (OSCAL WITH D) 500-200 MG-UNIT per tablet Take 1 tablet by mouth 2 (two) times daily.      . carvedilol (COREG) 25 MG tablet Take 37.5-50 mg by mouth 2 (two) times daily with a meal. Take 2 tablets in the morning and 1.5 tablets in the evening      . cholecalciferol (VITAMIN D) 1000 UNITS tablet Take 1,000 Units by mouth daily.      . cycloSPORINE (RESTASIS) 0.05 % ophthalmic emulsion Place 1 drop into both eyes 2 (two) times daily.      . diphenhydrAMINE (BENADRYL) 25 MG tablet Take 25 mg by mouth every other day.      Marland Kitchen EPINEPHrine 0.3 mg/0.3 mL IJ SOAJ injection Inject 0.3 mg into the muscle once as needed (severe allergic reaction).      . fish oil-omega-3 fatty acids 1000 MG capsule Take 1 g by mouth 3 (three) times daily.      . fluticasone (FLONASE) 50 MCG/ACT nasal spray Place 2 sprays into the nose daily.      . furosemide (LASIX) 80 MG tablet Take 40-80 mg by mouth 2 (two) times daily. Take one tablet in the morning and half tablet at lunch time      . gabapentin (NEURONTIN) 300 MG capsule Take 300 mg by mouth 3 (three) times daily.      . hydrALAZINE (APRESOLINE) 50 MG tablet Take 50 mg by mouth 3 (three) times daily.      Marland Kitchen lactulose, encephalopathy, (GENERLAC) 10 GM/15ML SOLN Take 10 g by mouth 2 (two) times daily.       Marland Kitchen levothyroxine (SYNTHROID, LEVOTHROID) 100 MCG tablet Take 100 mcg by mouth daily before breakfast.       . nitroGLYCERIN (NITROSTAT) 0.4 MG SL tablet Place 0.4 mg under the tongue every 5 (five) minutes as needed for chest pain. For chest pain      . NOVOLIN N RELION 100 UNIT/ML injection Inject 20 Units into the skin 2 (two) times daily before a meal.       . Oxycodone HCl 10 MG  TABS Take 10 mg by mouth 2  (two) times daily.      . pantoprazole (PROTONIX) 20 MG tablet Take 20 mg by mouth 2 (two) times daily.      . potassium chloride (K-DUR) 10 MEQ tablet Take 20 mEq by mouth daily.       . simvastatin (ZOCOR) 40 MG tablet Take 40 mg by mouth every evening.      . SYMBICORT 80-4.5 MCG/ACT inhaler Inhale 2 puffs into the lungs 2 (two) times daily.      Marland Kitchen tiZANidine (ZANAFLEX) 2 MG tablet Take 2 mg by mouth 3 (three) times daily with meals.      . traZODone (DESYREL) 50 MG tablet Take 50 mg by mouth at bedtime.       . valsartan (DIOVAN) 320 MG tablet Take 320 mg by mouth at bedtime.      Marland Kitchen VICTOZA 18 MG/3ML SOPN Inject 1.2 Units into the skin every morning.       . DULoxetine (CYMBALTA) 60 MG capsule Take 60 mg by mouth every evening.       . fexofenadine (ALLEGRA) 180 MG tablet Take 180 mg by mouth every other day.      Marland Kitchen HYDROmorphone (DILAUDID) 2 MG tablet Take 1 tablet (2 mg total) by mouth every 4 (four) hours as needed for severe pain.  20 tablet  0  . insulin glargine (LANTUS) 100 UNIT/ML injection Inject 40 Units into the skin at bedtime.       . predniSONE (DELTASONE) 20 MG tablet Take 3 tablets (60 mg total) by mouth daily.  15 tablet  0   No current facility-administered medications on file prior to visit.     REVIEW OF SYSTEMS: No changes  PHYSICAL EXAMINATION:   Vital signs are BP 157/68  Pulse 57  Ht 5\' 8"  (1.727 m)  Wt 271 lb (122.925 kg)  BMI 41.22 kg/m2  SpO2 95% General: The patient appears their stated age. HEENT:  No gross abnormalities Pulmonary:  Non labored breathing Neurologic: No focal weakness or paresthesias are detected, Skin: There are no ulcer or rashes noted. Psychiatric: The patient has normal affect. Cardiovascular: Palpable thrill within left arm fistula   Diagnostic Studies ultrasound shows an excellent caliber left basilic vein with diameter measures greater than 0.6 cm  Assessment: End-stage renal disease Plan: The patient will be  scheduled for second stage basilic vein transposition on 01/29/2014.  Risks and benefits were discussed with the patient.  All questions were answered  V. Diona Foley, M.D. [facility] [place] Office: [phone] Pager:  [phone]

## 2014-01-29 NOTE — Interval H&P Note (Signed)
History and Physical Interval Note:  01/29/2014 7:22 AM  Donna Sweeney  has presented today for surgery, with the diagnosis of Chronic kidney disease, stage IV   The various methods of treatment have been discussed with the patient and family. After consideration of risks, benefits and other options for treatment, the patient has consented to  Procedure(s): [place] (Left) as a surgical intervention .  The patient's history has been reviewed, patient examined, no change in status, stable for surgery.  I have reviewed the patient's chart and labs.  Questions were answered to the patient's satisfaction.     BRABHAM IV, V. WELLS

## 2014-01-29 NOTE — Transfer of Care (Signed)
Immediate Anesthesia Transfer of Care Note  Patient: Donna Sweeney  Procedure(s) Performed: Procedure(s): CANCELLED PROCEDURE  Patient Location: PACU  Anesthesia Type:General  Level of Consciousness: awake, alert  and oriented  Airway & Oxygen Therapy: Patient Spontanous Breathing and Patient connected to face mask oxygen  Post-op Assessment: Report given to PACU RN, Post -op Vital signs reviewed and stable and Patient moving all extremities X 4  Post vital signs: Reviewed and stable  Complications: No apparent anesthesia complications

## 2014-01-29 NOTE — Anesthesia Preprocedure Evaluation (Addendum)
Anesthesia Evaluation  Patient identified by MRN, date of birth, ID band Patient awake    Reviewed: Allergy & Precautions, H&P , NPO status , Patient's Chart, lab work & pertinent test results, reviewed documented beta blocker date and time   Airway Mallampati: II      Dental  (+) Dental Advisory Given, Partial Upper   Pulmonary asthma , sleep apnea and Continuous Positive Airway Pressure Ventilation ,  breath sounds clear to auscultation        Cardiovascular hypertension, Pt. on home beta blockers and Pt. on medications + angina + CAD, + Past MI, + Peripheral Vascular Disease and +CHF Rhythm:Regular     Neuro/Psych  Neuromuscular disease    GI/Hepatic hiatal hernia, PUD, GERD-  ,  Endo/Other  diabetes, Well Controlled, Type 2Hypothyroidism   Renal/GU      Musculoskeletal  (+) Fibromyalgia -  Abdominal   Peds  Hematology  (+) anemia ,   Anesthesia Other Findings   Reproductive/Obstetrics                        Anesthesia Physical Anesthesia Plan  ASA: IV  Anesthesia Plan: General   Post-op Pain Management:    Induction: Intravenous  Airway Management Planned: Oral ETT  Additional Equipment:   Intra-op Plan:   Post-operative Plan: Extubation in OR and Possible Post-op intubation/ventilation  Informed Consent:   Dental advisory given  Plan Discussed with: CRNA and Anesthesiologist  Anesthesia Plan Comments:       Anesthesia Quick Evaluation

## 2014-01-29 NOTE — Anesthesia Procedure Notes (Signed)
Procedure Name: Intubation Date/Time: 01/29/2014 8:20 AM Performed by: Ollen Bowl Pre-anesthesia Checklist: Patient identified, Emergency Drugs available, Suction available, Patient being monitored and Timeout performed Patient Re-evaluated:Patient Re-evaluated prior to inductionOxygen Delivery Method: Circle system utilized and Simple face mask Preoxygenation: Pre-oxygenation with 100% oxygen Intubation Type: IV induction Ventilation: Mask ventilation without difficulty and Oral airway inserted - appropriate to patient size Laryngoscope Size: Mac and 4 Grade View: Grade I Tube type: Oral Tube size: 7.5 mm Number of attempts: 1 Airway Equipment and Method: Patient positioned with wedge pillow and Stylet Placement Confirmation: ETT inserted through vocal cords under direct vision,  positive ETCO2 and breath sounds checked- equal and bilateral Secured at: 22 cm Tube secured with: Tape Dental Injury: Teeth and Oropharynx as per pre-operative assessment

## 2014-01-30 ENCOUNTER — Other Ambulatory Visit: Payer: Self-pay | Admitting: *Deleted

## 2014-01-30 ENCOUNTER — Encounter (HOSPITAL_COMMUNITY): Payer: Self-pay | Admitting: General Practice

## 2014-01-30 ENCOUNTER — Encounter (HOSPITAL_COMMUNITY): Payer: Medicare Other | Admitting: Anesthesiology

## 2014-01-30 ENCOUNTER — Encounter (HOSPITAL_COMMUNITY)
Admission: RE | Disposition: A | Discharge status: Home / Self Care | Payer: Self-pay | Source: Ambulatory Visit | Attending: Surgery

## 2014-01-30 ENCOUNTER — Telehealth: Payer: Self-pay | Admitting: Surgery

## 2014-01-30 ENCOUNTER — Inpatient Hospital Stay (HOSPITAL_COMMUNITY): Payer: Medicare Other | Admitting: Anesthesiology

## 2014-01-30 DIAGNOSIS — Z4931 Encounter for adequacy testing for hemodialysis: Secondary | ICD-10-CM

## 2014-01-30 DIAGNOSIS — N186 End stage renal disease: Secondary | ICD-10-CM

## 2014-01-30 DIAGNOSIS — N185 Chronic kidney disease, stage 5: Secondary | ICD-10-CM

## 2014-01-30 HISTORY — PX: BASCILIC VEIN TRANSPOSITION: SHX5742

## 2014-01-30 LAB — GLUCOSE, CAPILLARY
GLUCOSE-CAPILLARY: 119 mg/dL — AB (ref 70–99)
GLUCOSE-CAPILLARY: 88 mg/dL (ref 70–99)
GLUCOSE-CAPILLARY: 109 mg/dL — AB (ref 70–99)
Glucose-Capillary: 98 mg/dL (ref 70–99)
Glucose-Capillary: 124 mg/dL — ABNORMAL HIGH (ref 70–99)

## 2014-01-30 SURGERY — TRANSPOSITION, VEIN, BASILIC
Anesthesia: Monitor Anesthesia Care | Site: Arm Upper | Laterality: Left

## 2014-01-30 SURGERY — CANCELLED PROCEDURE
Anesthesia: General | Laterality: Left

## 2014-01-30 MED ORDER — LIDOCAINE HCL (CARDIAC) 20 MG/ML IV SOLN
INTRAVENOUS | Status: AC
  Filled 2014-01-30: qty 5

## 2014-01-30 MED ORDER — PHENYLEPHRINE HCL 10 MG/ML IJ SOLN
10.0000 mg | INTRAVENOUS | Status: DC | PRN
  Administered 2014-01-30: 20 ug/min via INTRAVENOUS

## 2014-01-30 MED ORDER — PROPOFOL 10 MG/ML IV BOLUS
INTRAVENOUS | Status: AC
  Filled 2014-01-30: qty 20

## 2014-01-30 MED ORDER — EPHEDRINE SULFATE 50 MG/ML IJ SOLN
INTRAMUSCULAR | Status: AC
  Filled 2014-01-30: qty 1

## 2014-01-30 MED ORDER — SODIUM CHLORIDE 0.9 % IV SOLN
INTRAVENOUS | Status: DC | PRN
  Administered 2014-01-30 (×2): via INTRAVENOUS

## 2014-01-30 MED ORDER — SODIUM CHLORIDE 0.9 % IR SOLN
Status: DC | PRN
  Administered 2014-01-30: 07:00:00

## 2014-01-30 MED ORDER — 0.9 % SODIUM CHLORIDE (POUR BTL) OPTIME
TOPICAL | Status: DC | PRN
  Administered 2014-01-30: 1000 mL

## 2014-01-30 MED ORDER — SODIUM CHLORIDE 0.9 % IJ SOLN
INTRAMUSCULAR | Status: AC
  Filled 2014-01-30: qty 10

## 2014-01-30 MED ORDER — FENTANYL CITRATE 0.05 MG/ML IJ SOLN
INTRAMUSCULAR | Status: DC | PRN
  Administered 2014-01-30: 100 ug via INTRAVENOUS
  Administered 2014-01-30: 50 ug via INTRAVENOUS
  Administered 2014-01-30 (×2): 25 ug via INTRAVENOUS

## 2014-01-30 MED ORDER — OXYCODONE HCL 5 MG PO TABS
ORAL_TABLET | ORAL | Status: AC
  Filled 2014-01-30: qty 1

## 2014-01-30 MED ORDER — GLYCOPYRROLATE 0.2 MG/ML IJ SOLN
INTRAMUSCULAR | Status: AC
  Filled 2014-01-30: qty 2

## 2014-01-30 MED ORDER — OXYCODONE-ACETAMINOPHEN 5-325 MG PO TABS: 1.0000 | ORAL_TABLET | Freq: Four times a day (QID) | ORAL | Status: DC | PRN

## 2014-01-30 MED ORDER — OXYCODONE HCL 5 MG/5ML PO SOLN: 5.0000 mg | Freq: Once | ORAL | Status: AC | PRN

## 2014-01-30 MED ORDER — ONDANSETRON HCL 4 MG/2ML IJ SOLN
INTRAMUSCULAR | Status: AC
  Filled 2014-01-30: qty 2

## 2014-01-30 MED ORDER — LIDOCAINE-EPINEPHRINE (PF) 1 %-1:200000 IJ SOLN
INTRAMUSCULAR | Status: DC | PRN
  Administered 2014-01-30: 30 mL

## 2014-01-30 MED ORDER — HEPARIN SODIUM (PORCINE) 1000 UNIT/ML IJ SOLN
INTRAMUSCULAR | Status: AC
  Filled 2014-01-30: qty 1

## 2014-01-30 MED ORDER — MIDAZOLAM HCL 2 MG/2ML IJ SOLN
INTRAMUSCULAR | Status: AC
  Filled 2014-01-30: qty 2

## 2014-01-30 MED ORDER — FENTANYL CITRATE 0.05 MG/ML IJ SOLN
INTRAMUSCULAR | Status: AC
  Filled 2014-01-30: qty 5

## 2014-01-30 MED ORDER — ROCURONIUM BROMIDE 50 MG/5ML IV SOLN
INTRAVENOUS | Status: AC
  Filled 2014-01-30: qty 1

## 2014-01-30 MED ORDER — MIDAZOLAM HCL 5 MG/5ML IJ SOLN
INTRAMUSCULAR | Status: DC | PRN
  Administered 2014-01-30: 1 mg via INTRAVENOUS

## 2014-01-30 MED ORDER — HYDROMORPHONE HCL PF 1 MG/ML IJ SOLN
INTRAMUSCULAR | Status: AC
  Filled 2014-01-30: qty 1

## 2014-01-30 MED ORDER — LIDOCAINE HCL (CARDIAC) 20 MG/ML IV SOLN
INTRAVENOUS | Status: DC | PRN
  Administered 2014-01-30: 30 mg via INTRAVENOUS

## 2014-01-30 MED ORDER — OXYCODONE HCL 5 MG PO TABS
5.0000 mg | ORAL_TABLET | Freq: Once | ORAL | Status: AC | PRN
  Administered 2014-01-30: 5 mg via ORAL

## 2014-01-30 MED ORDER — DEXTROSE 5 % IV SOLN
1.5000 g | INTRAVENOUS | Status: DC | PRN
  Administered 2014-01-30: 1.5 g via INTRAVENOUS

## 2014-01-30 MED ORDER — ONDANSETRON HCL 4 MG/2ML IJ SOLN: 4.0000 mg | Freq: Once | INTRAMUSCULAR | Status: DC | PRN

## 2014-01-30 MED ORDER — DEXTROSE 5 % IV SOLN
INTRAVENOUS | Status: AC
  Filled 2014-01-30: qty 1.5

## 2014-01-30 MED ORDER — HYDROMORPHONE HCL PF 1 MG/ML IJ SOLN
0.2500 mg | INTRAMUSCULAR | Status: DC | PRN
  Administered 2014-01-30: 0.5 mg via INTRAVENOUS

## 2014-01-30 MED ORDER — ETOMIDATE 2 MG/ML IV SOLN
INTRAVENOUS | Status: DC | PRN
  Administered 2014-01-30: 20 mg via INTRAVENOUS

## 2014-01-30 MED ORDER — NEOSTIGMINE METHYLSULFATE 10 MG/10ML IV SOLN
INTRAVENOUS | Status: AC
  Filled 2014-01-30: qty 1

## 2014-01-30 MED ORDER — ETOMIDATE 2 MG/ML IV SOLN
INTRAVENOUS | Status: AC
  Filled 2014-01-30: qty 10

## 2014-01-30 MED ORDER — SUCCINYLCHOLINE CHLORIDE 20 MG/ML IJ SOLN
INTRAMUSCULAR | Status: AC
  Filled 2014-01-30: qty 1

## 2014-01-30 SURGICAL SUPPLY — 39 items
BLADE 10 SAFETY STRL DISP (BLADE) IMPLANT
CANISTER SUCTION 2500CC (MISCELLANEOUS) ×3 IMPLANT
CLIP TI MEDIUM 24 (CLIP) ×3 IMPLANT
CLIP TI MEDIUM 6 (CLIP) IMPLANT
CLIP TI WIDE RED SMALL 24 (CLIP) ×3 IMPLANT
CLIP TI WIDE RED SMALL 6 (CLIP) IMPLANT
COVER PROBE W GEL 5X96 (DRAPES) ×3 IMPLANT
COVER SURGICAL LIGHT HANDLE (MISCELLANEOUS) ×3 IMPLANT
DERMABOND ADVANCED (GAUZE/BANDAGES/DRESSINGS) ×2
DERMABOND ADVANCED .7 DNX12 (GAUZE/BANDAGES/DRESSINGS) ×1 IMPLANT
ELECT REM PT RETURN 9FT ADLT (ELECTROSURGICAL) ×3
ELECTRODE REM PT RTRN 9FT ADLT (ELECTROSURGICAL) ×1 IMPLANT
GLOVE BIOGEL PI IND STRL 7.5 (GLOVE) ×1 IMPLANT
GLOVE BIOGEL PI INDICATOR 7.5 (GLOVE) ×2
GLOVE SURG SS PI 7.5 STRL IVOR (GLOVE) ×3 IMPLANT
GOWN STRL REUS W/ TWL LRG LVL3 (GOWN DISPOSABLE) ×2 IMPLANT
GOWN STRL REUS W/ TWL XL LVL3 (GOWN DISPOSABLE) ×1 IMPLANT
GOWN STRL REUS W/TWL LRG LVL3 (GOWN DISPOSABLE) ×4
GOWN STRL REUS W/TWL XL LVL3 (GOWN DISPOSABLE) ×2
HEMOSTAT SNOW SURGICEL 2X4 (HEMOSTASIS) IMPLANT
KIT BASIN OR (CUSTOM PROCEDURE TRAY) ×3 IMPLANT
KIT ROOM TURNOVER OR (KITS) ×3 IMPLANT
NS IRRIG 1000ML POUR BTL (IV SOLUTION) ×3 IMPLANT
PACK CV ACCESS (CUSTOM PROCEDURE TRAY) ×3 IMPLANT
PAD ARMBOARD 7.5X6 YLW CONV (MISCELLANEOUS) ×6 IMPLANT
SPONGE LAP 18X18 X RAY DECT (DISPOSABLE) ×3 IMPLANT
SUT PROLENE 6 0 CC (SUTURE) ×3 IMPLANT
SUT SILK 2 0 SH (SUTURE) IMPLANT
SUT SILK 3 0 (SUTURE) ×2
SUT SILK 3-0 18XBRD TIE 12 (SUTURE) ×1 IMPLANT
SUT VIC AB 2-0 CT1 27 (SUTURE) ×4
SUT VIC AB 2-0 CT1 TAPERPNT 27 (SUTURE) ×2 IMPLANT
SUT VIC AB 3-0 SH 27 (SUTURE) ×2
SUT VIC AB 3-0 SH 27X BRD (SUTURE) ×1 IMPLANT
SUT VICRYL 4-0 PS2 18IN ABS (SUTURE) ×6 IMPLANT
TOWEL OR 17X24 6PK STRL BLUE (TOWEL DISPOSABLE) ×3 IMPLANT
TOWEL OR 17X26 10 PK STRL BLUE (TOWEL DISPOSABLE) ×3 IMPLANT
UNDERPAD 30X30 INCONTINENT (UNDERPADS AND DIAPERS) ×3 IMPLANT
WATER STERILE IRR 1000ML POUR (IV SOLUTION) ×3 IMPLANT

## 2014-01-30 SURGICAL SUPPLY — 40 items
BLADE 10 SAFETY STRL DISP (BLADE) ×3 IMPLANT
CANISTER SUCTION 2500CC (MISCELLANEOUS) ×3 IMPLANT
CLIP TI MEDIUM 24 (CLIP) IMPLANT
CLIP TI MEDIUM 6 (CLIP) IMPLANT
CLIP TI WIDE RED SMALL 24 (CLIP) IMPLANT
CLIP TI WIDE RED SMALL 6 (CLIP) IMPLANT
COVER PROBE W GEL 5X96 (DRAPES) ×3 IMPLANT
COVER SURGICAL LIGHT HANDLE (MISCELLANEOUS) ×3 IMPLANT
DERMABOND ADVANCED (GAUZE/BANDAGES/DRESSINGS) ×2
DERMABOND ADVANCED .7 DNX12 (GAUZE/BANDAGES/DRESSINGS) ×1 IMPLANT
ELECT REM PT RETURN 9FT ADLT (ELECTROSURGICAL) ×3
ELECTRODE REM PT RTRN 9FT ADLT (ELECTROSURGICAL) ×1 IMPLANT
GLOVE BIO SURGEON STRL SZ 6.5 (GLOVE) ×2 IMPLANT
GLOVE BIO SURGEONS STRL SZ 6.5 (GLOVE) ×1
GLOVE BIOGEL PI IND STRL 6.5 (GLOVE) ×1 IMPLANT
GLOVE BIOGEL PI IND STRL 7.5 (GLOVE) ×1 IMPLANT
GLOVE BIOGEL PI INDICATOR 6.5 (GLOVE) ×2
GLOVE BIOGEL PI INDICATOR 7.5 (GLOVE) ×2
GLOVE SURG SS PI 6.5 STRL IVOR (GLOVE) ×3 IMPLANT
GLOVE SURG SS PI 7.0 STRL IVOR (GLOVE) ×3 IMPLANT
GLOVE SURG SS PI 7.5 STRL IVOR (GLOVE) ×3 IMPLANT
GOWN STRL REUS W/ TWL LRG LVL3 (GOWN DISPOSABLE) ×2 IMPLANT
GOWN STRL REUS W/ TWL XL LVL3 (GOWN DISPOSABLE) ×1 IMPLANT
GOWN STRL REUS W/TWL LRG LVL3 (GOWN DISPOSABLE) ×4
GOWN STRL REUS W/TWL XL LVL3 (GOWN DISPOSABLE) ×2
HEMOSTAT SNOW SURGICEL 2X4 (HEMOSTASIS) IMPLANT
KIT BASIN OR (CUSTOM PROCEDURE TRAY) ×3 IMPLANT
KIT ROOM TURNOVER OR (KITS) ×3 IMPLANT
NS IRRIG 1000ML POUR BTL (IV SOLUTION) ×3 IMPLANT
PACK CV ACCESS (CUSTOM PROCEDURE TRAY) ×3 IMPLANT
PAD ARMBOARD 7.5X6 YLW CONV (MISCELLANEOUS) ×6 IMPLANT
SUT PROLENE 6 0 CC (SUTURE) ×3 IMPLANT
SUT SILK 2 0 SH (SUTURE) ×3 IMPLANT
SUT VIC AB 3-0 SH 27 (SUTURE) ×2
SUT VIC AB 3-0 SH 27X BRD (SUTURE) ×1 IMPLANT
SUT VICRYL 4-0 PS2 18IN ABS (SUTURE) IMPLANT
TOWEL OR 17X24 6PK STRL BLUE (TOWEL DISPOSABLE) ×3 IMPLANT
TOWEL OR 17X26 10 PK STRL BLUE (TOWEL DISPOSABLE) ×3 IMPLANT
UNDERPAD 30X30 INCONTINENT (UNDERPADS AND DIAPERS) ×3 IMPLANT
WATER STERILE IRR 1000ML POUR (IV SOLUTION) ×3 IMPLANT

## 2014-01-30 NOTE — Op Note (Signed)
    Patient name: ANDREEA ARCA MRN: [id number] DOB: 1945-12-09 Sex: female  01/29/2014 - 01/30/2014 Pre-operative Diagnosis: end stage renal disease Post-operative diagnosis:  Same Surgeon:  Eldridge Abrahams Assistants:  Lennie Muckle Procedure:   Second stage left basilic vein transposition Anesthesia:  Gen. and Blood Loss:  See anesthesia record Specimens:  none  Findings:  Excellent caliber vein  Indications:  The patient was scheduled for this procedure yesterday, however she had a right mainstem intubation and hypotension.  Therefore the procedure was canceled and she is brought back today.  Procedure:  The patient was identified in the holding area and taken to [facility]  The patient was then placed supine on the table. general anesthesia was administered.  The patient was prepped and draped in the usual sterile fashion.  A time out was called and antibiotics were administered.  I made a longitudinal incision just proximal to the antecubital crease anterior to the basilic vein.  The basilic vein was circumferentially exposed.  Side branches were ligated between silk ties within the incision.  The vein was fully mobilized.  I then proceeded mobilizing the vein through separate incision more proximal in the arm.  The vein measured approximately 6 mm.  I protected the nerve running along the vein.  I did not feel that I needed to redo the anastomosis.  Therefore I elected to just elevate the basilic vein.  Once the vein was fully mobilized within the incision, I reapproximated the subcutaneous tissue deep to the vein.  I closed the skin directly anterior to the fistula 4-0 Vicryl.  Dermabond was applied.   Disposition:  To PACU in stable condition.   Theotis Burrow, M.D. [facility] [place] Office: [phone] Pager:  [phone]

## 2014-01-30 NOTE — Progress Notes (Signed)
Report given  to maryann rn as cargiver 

## 2014-01-30 NOTE — Progress Notes (Signed)
Care of pt assumed by MA Lorene Klimas RN from S. Gregson RN 

## 2014-01-30 NOTE — Transfer of Care (Signed)
Immediate Anesthesia Transfer of Care Note  Patient: Donna Sweeney  Procedure(s) Performed: Procedure(s): SECOND STAGE [place] (Left)  Patient Location: PACU  Anesthesia Type:General  Level of Consciousness: awake, alert , oriented and patient cooperative  Airway & Oxygen Therapy: Patient Spontanous Breathing and Patient connected to nasal cannula oxygen  Post-op Assessment: Report given to PACU RN, Post -op Vital signs reviewed and stable and Patient moving all extremities  Post vital signs: Reviewed and stable  Complications: No apparent anesthesia complications

## 2014-01-30 NOTE — Anesthesia Preprocedure Evaluation (Addendum)
Anesthesia Evaluation  Patient identified by MRN, date of birth, ID band Patient awake    Reviewed: Allergy & Precautions, H&P , NPO status , Patient's Chart, lab work & pertinent test results  History of Anesthesia Complications (+) history of anesthetic complications  Airway Mallampati: II      Dental  (+) Edentulous Upper   Pulmonary asthma , sleep apnea and Continuous Positive Airway Pressure Ventilation ,    Pulmonary exam normal       Cardiovascular hypertension, + CAD, + Past MI, + Peripheral Vascular Disease and +CHF Rhythm:Regular Rate:Normal  Patient states she was told she has a murmur.   Neuro/Psych Patient worked up for black out spell in 2001 without etiology.    GI/Hepatic GERD-  Medicated,  Endo/Other  diabetes, Well Controlled, Type 1  Renal/GU Renal disease     Musculoskeletal   Abdominal Normal abdominal exam  (+)   Peds  Hematology   Anesthesia Other Findings   Reproductive/Obstetrics                          Anesthesia Physical Anesthesia Plan  ASA: III  Anesthesia Plan: MAC   Post-op Pain Management:    Induction: Intravenous  Airway Management Planned: Simple Face Mask and Natural Airway  Additional Equipment:   Intra-op Plan:   Post-operative Plan:   Informed Consent:   Dental advisory given  Plan Discussed with: CRNA, Anesthesiologist and Surgeon  Anesthesia Plan Comments:         Anesthesia Quick Evaluation

## 2014-01-30 NOTE — Telephone Encounter (Signed)
lvm re post op follow up appointment 02/23/14 8:45 am and sent letter - kf

## 2014-01-30 NOTE — Interval H&P Note (Signed)
History and Physical Interval Note:  01/30/2014 7:20 AM  Donna Sweeney  has presented today for surgery, with the diagnosis of Chronic Kidney Disease, Stage IV  The various methods of treatment have been discussed with the patient and family. After consideration of risks, benefits and other options for treatment, the patient has consented to  Procedure(s): [place] (Left) as a surgical intervention .  The patient's history has been reviewed, patient examined, no change in status, stable for surgery.  I have reviewed the patient's chart and labs.  Questions were answered to the patient's satisfaction.     Lus Kriegel IV, V. WELLS

## 2014-01-30 NOTE — Anesthesia Postprocedure Evaluation (Signed)
  Anesthesia Post-op Note  Patient: Donna Sweeney  Procedure(s) Performed: Procedure(s): SECOND STAGE [place] (Left)  Patient Location: PACU  Anesthesia Type:General  Level of Consciousness: awake, alert  and oriented  Airway and Oxygen Therapy: Patient Spontanous Breathing and Patient connected to nasal cannula oxygen  Post-op Pain: none  Post-op Assessment: Post-op Vital signs reviewed, Patient's Cardiovascular Status Stable, Respiratory Function Stable, Patent Airway and Pain level controlled  Post-op Vital Signs: stable  Last Vitals:  Filed Vitals:   01/30/14 1351  BP: 144/67  Pulse: 61  Temp: 36.9 C  Resp: 18    Complications: No apparent anesthesia complications

## 2014-01-31 LAB — GLUCOSE, CAPILLARY: Glucose-Capillary: 106 mg/dL — ABNORMAL HIGH (ref 70–99)

## 2014-01-31 NOTE — Progress Notes (Addendum)
  [facility] Progress Note  01/31/2014 8:41 AM 1 Day Post-Op  Subjective:  Pt having some soreness in left upper arm this morning. Pain is well controlled. Stayed in the hospital last night due to transportation issues.  Tmax 99.1 BP sys 140s-160s 02 99% RA  Filed Vitals:   01/31/14 0405  BP: 169/79  Pulse: 58  Temp: 98.8 F (37.1 C)  Resp: 17    Physical Exam: Incisions: C/D/I, ecchymosis around left upper arm incisions. Palpable thrill.  Extremities:  Radial pulses palpable bilaterally. 5/5 grip strength left hand.   CBC    Component Value Date/Time   WBC 12.2* 01/29/2014 1205   RBC 3.74* 01/29/2014 1205   HGB 11.0* 01/29/2014 1205   HCT 34.4* 01/29/2014 1205   PLT 241 01/29/2014 1205   MCV 92.0 01/29/2014 1205   MCH 29.4 01/29/2014 1205   MCHC 32.0 01/29/2014 1205   RDW 13.6 01/29/2014 1205   LYMPHSABS 3.2 01/19/2014 0152   MONOABS 0.6 01/19/2014 0152   EOSABS 0.3 01/19/2014 0152   BASOSABS 0.0 01/19/2014 0152    BMET    Component Value Date/Time   NA 140 01/29/2014 0650   K 3.4* 01/29/2014 0650   CL 97 01/19/2014 0152   CO2 29 01/19/2014 0152   GLUCOSE 106* 01/29/2014 0650   BUN 49* 01/19/2014 0152   CREATININE 2.46* 01/29/2014 1205   CALCIUM 9.9 01/19/2014 0152   GFRNONAA 19* 01/29/2014 1205   GFRAA 22* 01/29/2014 1205    INR No results found for this basename: inr     Intake/Output Summary (Last 24 hours) at 01/31/14 0841 Last data filed at 01/31/14 0700  Gross per 24 hour  Intake   1220 ml  Output   2225 ml  Net  -1005 ml     Assessment:  68 y.o. female is s/p:  Second stage basilic vein transposition 1 Day Post-Op  Plan: -Patient had transportation issues yesterday post-op and was admitted. Will discharge home this morning. -Will follow-up with Dr. Trula Slade in 4 weeks.   Virgina Jock, PA-C [facility] Office: [phone] Pager: [phone] 01/31/2014 8:41 AM     I agree with the above.  The patient has been seen  and examined.  She is complaining of mild discomfort at the left upper arm operation site.  There is an excellent thrill within her fistula.  She is scheduled for discharge today.  Annamarie Major

## 2014-01-31 NOTE — Progress Notes (Signed)
Pt D/c'd home with sister.  Alert and oriented x4.  No c/o pain.  Dressing to arm is dry/intact.  Pt given education on diet, activity, meds, and follow-up care and instructions.  Pt verbalized understanding.  IV D/C'd.  Tele D/c'd.

## 2014-01-31 NOTE — Discharge Summary (Signed)
[facility] Discharge Summary  Donna Sweeney [birth year] 67 y.o. female  [id number]  Admission Date: 01/29/2014  Discharge Date: 01/31/14  Physician: Serafina Mitchell, MD  Admission Diagnosis: Chronic kidney disease, stage IV  Chronic Kidney Disease, Stage IV   HPI:   This is a 68 y.o. female s/p left arm first stage basilic vein transposition on 12/05/2013. She has no complaints. She denies symptoms of steal syndrome.  Hospital Course:  The patient was admitted to the hospital and taken to the operating room on 01/29/2014- 01/30/2014 and underwent: second stage basilic vein transposition.   The pt tolerated the procedure well and was transported to the PACU in good condition.  She was admitted because of transportation difficulties following surgery.   She had pain and soreness with her incisions but denied any symptoms of steal syndrome. She has been able tolerate a regular diet and ambulate without difficulty. She was discharged home on POD1 in good condition.     CBC    Component Value Date/Time   WBC 12.2* 01/29/2014 1205   RBC 3.74* 01/29/2014 1205   HGB 11.0* 01/29/2014 1205   HCT 34.4* 01/29/2014 1205   PLT 241 01/29/2014 1205   MCV 92.0 01/29/2014 1205   MCH 29.4 01/29/2014 1205   MCHC 32.0 01/29/2014 1205   RDW 13.6 01/29/2014 1205   LYMPHSABS 3.2 01/19/2014 0152   MONOABS 0.6 01/19/2014 0152   EOSABS 0.3 01/19/2014 0152   BASOSABS 0.0 01/19/2014 0152    BMET    Component Value Date/Time   NA 140 01/29/2014 0650   K 3.4* 01/29/2014 0650   CL 97 01/19/2014 0152   CO2 29 01/19/2014 0152   GLUCOSE 106* 01/29/2014 0650   BUN 49* 01/19/2014 0152   CREATININE 2.46* 01/29/2014 1205   CALCIUM 9.9 01/19/2014 0152   GFRNONAA 19* 01/29/2014 1205   GFRAA 22* 01/29/2014 1205     Discharge Instructions:   The patient is discharged to home with extensive instructions on wound care and progressive ambulation.  They are instructed not to drive or perform any heavy  lifting until returning to see the physician in his office.  Discharge Instructions   Call MD for:  redness, tenderness, or signs of infection (pain, swelling, bleeding, redness, odor or green/yellow discharge around incision site)    Complete by:  As directed      Call MD for:  severe or increased pain, loss or decreased feeling  in affected limb(s)    Complete by:  As directed      Call MD for:  temperature >100.5    Complete by:  As directed      Discharge instructions    Complete by:  As directed   You may wash your arm in 24 hours.     Driving Restrictions    Complete by:  As directed   No driving for 24 weeks     Increase activity slowly    Complete by:  As directed   Walk with assistance use walker or cane as needed     Lifting restrictions    Complete by:  As directed   No lifting for 6 weeks     Resume previous diet    Complete by:  As directed            Discharge Diagnosis:  Chronic kidney disease, stage IV  Chronic Kidney Disease, Stage IV  Secondary Diagnosis: Patient Active Problem List   Diagnosis Date Noted  .  ESRD (end stage renal disease) 01/29/2014  . Obesity- BMI 41 12/03/2013  . Hypothyroid 12/03/2013  . Diabetes mellitus type 2, controlled, with complications 16/05/9603  . Hypertensive cardiovascular disease- LVH, grade 1 diastolic dysfunction 54/04/8118  . End stage renal disease 11/24/2013  . Palpitations 09/27/2013  . Ulcerative colitis, unspecified 09/04/2013  . Abdominal pain, generalized 09/04/2013  . Chronic renal insufficiency, stage IV (severe) 05/28/2013  . CAD- non obstructive CAD- last cath 2013 in [place]   . OSA (obstructive sleep apnea)- C-pap compliant   . Hyperlipidemia   . Hypertension    Past Medical History  Diagnosis Date  . Hypertension   . CHF (congestive heart failure)   . Constipation   . Hypothyroidism   . GERD (gastroesophageal reflux disease)   . Hyperlipidemia   . Hx of seasonal allergies   . Fibromyalgia   .  Arthritis   . Angina at rest   . Asthma   . Diabetes mellitus   . History of cardiac cath [phone]    non obstructive CAD (done in [place]) EF 55%  . Herniated cervical disc   . Sciatica   . Diverticulitis   . Ulcerative colitis   . CAD (coronary artery disease)     non obstructive disease  . Biventricular heart failure   . Echocardiogram abnormal 10/17/12    EF 86-57%, grade I diastolic dysfunction, trivial MR, severely dilated LA  . Myocardial infarction   . Peripheral vascular disease   . Anemia   . Chronic kidney disease     stage 4 chronic kidney disease, lasr saw dr Debroah Baller  . Achilles tendon rupture 07-01-2013    right, wears boot to knee  . Bruises easily     both legs  . OSA (obstructive sleep apnea)     cpap setting of 9  . DVT (deep venous thrombosis)   . Heart murmur   . H/O hiatal hernia        Medication List         albuterol 108 (90 BASE) MCG/ACT inhaler  Commonly known as:  PROVENTIL HFA;VENTOLIN HFA  Inhale 2 puffs into the lungs 2 (two) times daily as needed for shortness of breath. For shortness of breath     aspirin 81 MG chewable tablet  Chew 81 mg by mouth every morning.     calcium-vitamin D 500-200 MG-UNIT per tablet  Commonly known as:  OSCAL WITH D  Take 1 tablet by mouth 2 (two) times daily.     carvedilol 25 MG tablet  Commonly known as:  COREG  Take 37.5-50 mg by mouth 2 (two) times daily with a meal. Take 2 tablets in the morning and 1.5 tablets in the evening     cholecalciferol 1000 UNITS tablet  Commonly known as:  VITAMIN D  Take 1,000 Units by mouth daily.     COLCRYS 0.6 MG tablet  Generic drug:  colchicine  Take 1 tablet by mouth 2 (two) times daily.     cycloSPORINE 0.05 % ophthalmic emulsion  Commonly known as:  RESTASIS  Place 1 drop into both eyes 2 (two) times daily.     diphenhydrAMINE 25 MG tablet  Commonly known as:  BENADRYL  Take 25 mg by mouth every other day.     DULoxetine 60 MG capsule    Commonly known as:  CYMBALTA  Take 60 mg by mouth every evening.     EPINEPHrine 0.3 mg/0.3 mL Soaj injection  Commonly known as:  EPI-PEN  Inject 0.3 mg into the muscle once as needed (severe allergic reaction).     fexofenadine 180 MG tablet  Commonly known as:  ALLEGRA  Take 180 mg by mouth every other day.     fish oil-omega-3 fatty acids 1000 MG capsule  Take 1 g by mouth 3 (three) times daily.     fluticasone 50 MCG/ACT nasal spray  Commonly known as:  FLONASE  Place 2 sprays into the nose daily.     furosemide 80 MG tablet  Commonly known as:  LASIX  Take 40-80 mg by mouth 2 (two) times daily. Take one tablet in the morning and half tablet at lunch time     gabapentin 300 MG capsule  Commonly known as:  NEURONTIN  Take 300 mg by mouth 3 (three) times daily.     GENERLAC 10 GM/15ML Soln  Generic drug:  lactulose (encephalopathy)  Take 10 g by mouth 2 (two) times daily.     hydrALAZINE 50 MG tablet  Commonly known as:  APRESOLINE  Take 50 mg by mouth 3 (three) times daily.     HYDROmorphone 2 MG tablet  Commonly known as:  DILAUDID  Take 1 tablet (2 mg total) by mouth every 4 (four) hours as needed for severe pain.     insulin glargine 100 UNIT/ML injection  Commonly known as:  LANTUS  Inject 40 Units into the skin at bedtime.     levothyroxine 100 MCG tablet  Commonly known as:  SYNTHROID, LEVOTHROID  Take 100 mcg by mouth daily before breakfast.     nitroGLYCERIN 0.4 MG SL tablet  Commonly known as:  NITROSTAT  Place 0.4 mg under the tongue every 5 (five) minutes as needed for chest pain. For chest pain     NOVOLIN N RELION 100 UNIT/ML injection  Generic drug:  insulin NPH Human  Inject 20 Units into the skin 2 (two) times daily before a meal.     NOVOLIN R 100 units/mL injection  Generic drug:  insulin regular  Inject 5 Units into the skin as directed.     OxyCODONE 10 mg T12a 12 hr tablet  Commonly known as:  OXYCONTIN  Take 10 mg by mouth 2  (two) times daily.     oxyCODONE-acetaminophen 5-325 MG per tablet  Commonly known as:  PERCOCET/ROXICET  Take 1 tablet by mouth every 6 (six) hours as needed for severe pain.     oxyCODONE-acetaminophen 5-325 MG per tablet  Commonly known as:  PERCOCET/ROXICET  Take 1 tablet by mouth every 6 (six) hours as needed for severe pain.     pantoprazole 20 MG tablet  Commonly known as:  PROTONIX  Take 20 mg by mouth 2 (two) times daily.     potassium chloride 10 MEQ tablet  Commonly known as:  K-DUR  Take 20 mEq by mouth daily.     predniSONE 20 MG tablet  Commonly known as:  DELTASONE  Take 3 tablets (60 mg total) by mouth daily.     simvastatin 40 MG tablet  Commonly known as:  ZOCOR  Take 40 mg by mouth every evening.     SYMBICORT 80-4.5 MCG/ACT inhaler  Generic drug:  budesonide-formoterol  Inhale 2 puffs into the lungs 2 (two) times daily.     tiZANidine 2 MG tablet  Commonly known as:  ZANAFLEX  Take 2 mg by mouth 3 (three) times daily with meals.     traZODone 50 MG tablet  Commonly known as:  DESYREL  Take 50 mg by mouth at bedtime.     valsartan 320 MG tablet  Commonly known as:  DIOVAN  Take 320 mg by mouth at bedtime.     VICTOZA 18 MG/3ML Sopn  Generic drug:  Liraglutide  Inject 1.2 Units into the skin every morning.        Percocet  #30 No Refill  Disposition: Home  Patient's condition: is Good  Follow up: 1. Dr. Trula Slade in 4 weeks   Virgina Jock, PA-C [facility] [phone] 01/31/2014  8:46 AM      I agree with the above.  The patient has been seen and examined.  She is complaining of mild discomfort at the left upper arm operation site.  There is an excellent thrill within her fistula.  She is scheduled for discharge today.  Annamarie Major

## 2014-02-01 NOTE — Care Management Note (Signed)
    Page 1 of 1   02/01/2014     7:47:30 AM CARE MANAGEMENT NOTE 02/01/2014  Patient:  NIOKA, THORINGTON   Account Number:  [id number]  Date Initiated:  02/01/2014  Documentation initiated by:  GRAVES-BIGELOW,Timohty Renbarger  Subjective/Objective Assessment:   Pt admitted for taken to the operating room on 01/29/2014- 01/30/2014 and underwent: second stage basilic vein transposition.     Action/Plan:   CM received consult for med assist. Pt has insurance medicare. CM unable to assit with medications at this time. New medications looks like for pain management. No further needs at this time.   Anticipated DC Date:  01/31/2014   Anticipated DC Plan:  [place]  CM consult      Choice offered to / List presented to:             Status of service:  Completed, signed off Medicare Important Message given?   (If response is "NO", the following Medicare IM given date fields will be blank) Date Medicare IM given:   Date Additional Medicare IM given:    Discharge Disposition:  HOME/SELF CARE  Per UR Regulation:  Reviewed for med. necessity/level of care/duration of stay  If discussed at [place] of Stay Meetings, dates discussed:    Comments:

## 2014-02-02 ENCOUNTER — Encounter (HOSPITAL_COMMUNITY): Payer: Self-pay | Admitting: Surgery

## 2014-02-02 NOTE — Op Note (Signed)
Procedure cancelled after induction of anesthesia.  Pt likely had right mainstem intubation which resulted in decreased O2 sats and hypotension.  Pt awakened from anesthesia and taken to PACU in stable condition.  Annamarie Major

## 2014-02-13 ENCOUNTER — Telehealth: Payer: Self-pay

## 2014-02-13 ENCOUNTER — Encounter: Payer: Self-pay | Admitting: Surgery

## 2014-02-13 NOTE — Telephone Encounter (Signed)
Scheduled, dpm

## 2014-02-13 NOTE — Telephone Encounter (Signed)
Phone call from pt.  Reported she has had increased pain in the left arm surgery site.  Reported the lower incision appears puffy.  Denied redness or drainage.  Stated she spoke with Dr. Trula Slade last night, and he told her to either come to office Friday or Monday for evaluation.  Stated she cannot come to office today, due to transportation.  Appt. given for 02/16/14 @ 1:30 PM.  Rozetta Nunnery w/ plan.  Advised if she has fever/chills, worsening pain, open incision, or pus-like drainage over the weekend, to go to the ER.  Verb. Understanding.

## 2014-02-16 ENCOUNTER — Ambulatory Visit (INDEPENDENT_AMBULATORY_CARE_PROVIDER_SITE_OTHER): Payer: Self-pay | Admitting: Surgery

## 2014-02-16 ENCOUNTER — Encounter: Payer: Self-pay | Admitting: Surgery

## 2014-02-16 VITALS — BP 160/60 | HR 64 | Ht 68.0 in | Wt 269.0 lb

## 2014-02-16 DIAGNOSIS — N186 End stage renal disease: Secondary | ICD-10-CM

## 2014-02-16 DIAGNOSIS — T82898A Other specified complication of vascular prosthetic devices, implants and grafts, initial encounter: Secondary | ICD-10-CM | POA: Insufficient documentation

## 2014-02-16 NOTE — Progress Notes (Signed)
[facility] POST OPERATIVE OFFICE NOTE  CC:  F/u for surgery  HPI:  This is a 68 y.o. female who is s/p 2nd stage left BVT on 01/30/14 by Dr. Trula Slade.  She comes in today with complaints of aching pain in her shoulder and the back of her arm.  She states she does have some soreness over the incisions, but this has improved.  She did have some fullness over the distal incision, but she states this has gotten better.  She states that she gets occasional cramps in her hand.   Allergies  Allergen Reactions  . Ivp Dye [Iodinated Diagnostic Agents] Shortness Of Breath, Itching and Swelling    Only reaction with IV Dye.  Pt. states iodine on the skin has never caused reaction.   Marland Kitchen Raspberry Shortness Of Breath, Itching and Swelling    Current Outpatient Prescriptions  Medication Sig Dispense Refill  . albuterol (PROVENTIL HFA;VENTOLIN HFA) 108 (90 BASE) MCG/ACT inhaler Inhale 2 puffs into the lungs 2 (two) times daily as needed for shortness of breath. For shortness of breath      . aspirin 81 MG chewable tablet Chew 81 mg by mouth every morning.       . calcium-vitamin D (OSCAL WITH D) 500-200 MG-UNIT per tablet Take 1 tablet by mouth 2 (two) times daily.      . carvedilol (COREG) 25 MG tablet Take 37.5-50 mg by mouth 2 (two) times daily with a meal. Take 2 tablets in the morning and 1.5 tablets in the evening      . cholecalciferol (VITAMIN D) 1000 UNITS tablet Take 1,000 Units by mouth daily.      Marland Kitchen COLCRYS 0.6 MG tablet Take 1 tablet by mouth 2 (two) times daily.      . cycloSPORINE (RESTASIS) 0.05 % ophthalmic emulsion Place 1 drop into both eyes 2 (two) times daily.      . diphenhydrAMINE (BENADRYL) 25 MG tablet Take 25 mg by mouth every other day.      . DULoxetine (CYMBALTA) 60 MG capsule Take 60 mg by mouth every evening.       Marland Kitchen EPINEPHrine 0.3 mg/0.3 mL IJ SOAJ injection Inject 0.3 mg into the muscle once as needed (severe allergic reaction).      . fexofenadine  (ALLEGRA) 180 MG tablet Take 180 mg by mouth every other day.      . fish oil-omega-3 fatty acids 1000 MG capsule Take 1 g by mouth 3 (three) times daily.      . fluticasone (FLONASE) 50 MCG/ACT nasal spray Place 2 sprays into the nose daily.      . furosemide (LASIX) 80 MG tablet Take 40-80 mg by mouth 2 (two) times daily. Take one tablet in the morning and half tablet at lunch time      . gabapentin (NEURONTIN) 300 MG capsule Take 300 mg by mouth 3 (three) times daily.      . hydrALAZINE (APRESOLINE) 50 MG tablet Take 50 mg by mouth 3 (three) times daily.      Marland Kitchen HYDROmorphone (DILAUDID) 2 MG tablet Take 1 tablet (2 mg total) by mouth every 4 (four) hours as needed for severe pain.  20 tablet  0  . insulin glargine (LANTUS) 100 UNIT/ML injection Inject 40 Units into the skin at bedtime.       Marland Kitchen lactulose, encephalopathy, (GENERLAC) 10 GM/15ML SOLN Take 10 g by mouth 2 (two) times daily.       Marland Kitchen levothyroxine (SYNTHROID,  LEVOTHROID) 100 MCG tablet Take 100 mcg by mouth daily before breakfast.       . nitroGLYCERIN (NITROSTAT) 0.4 MG SL tablet Place 0.4 mg under the tongue every 5 (five) minutes as needed for chest pain. For chest pain      . NOVOLIN N RELION 100 UNIT/ML injection Inject 20 Units into the skin 2 (two) times daily before a meal.       . NOVOLIN R 100 UNIT/ML injection Inject 5 Units into the skin as directed.      . OxyCODONE (OXYCONTIN) 10 mg T12A 12 hr tablet Take 10 mg by mouth 2 (two) times daily.      Marland Kitchen oxyCODONE-acetaminophen (PERCOCET/ROXICET) 5-325 MG per tablet Take 1 tablet by mouth every 6 (six) hours as needed for severe pain.      Marland Kitchen oxyCODONE-acetaminophen (PERCOCET/ROXICET) 5-325 MG per tablet Take 1 tablet by mouth every 6 (six) hours as needed for severe pain.  30 tablet  0  . pantoprazole (PROTONIX) 20 MG tablet Take 20 mg by mouth 2 (two) times daily.      . potassium chloride (K-DUR) 10 MEQ tablet Take 20 mEq by mouth daily.       . predniSONE (DELTASONE) 20 MG  tablet Take 3 tablets (60 mg total) by mouth daily.  15 tablet  0  . simvastatin (ZOCOR) 40 MG tablet Take 40 mg by mouth every evening.      . SYMBICORT 80-4.5 MCG/ACT inhaler Inhale 2 puffs into the lungs 2 (two) times daily.      Marland Kitchen tiZANidine (ZANAFLEX) 2 MG tablet Take 2 mg by mouth 3 (three) times daily with meals.      . traZODone (DESYREL) 50 MG tablet Take 50 mg by mouth at bedtime.       . valsartan (DIOVAN) 320 MG tablet Take 320 mg by mouth at bedtime.      Marland Kitchen VICTOZA 18 MG/3ML SOPN Inject 1.2 Units into the skin every morning.        No current facility-administered medications for this visit.     ROS:  See HPI  Physical Exam:  Filed Vitals:   02/16/14 1336  BP: 160/60  Pulse: 64    Incision:  Healing nicely Extremities:  Left hand is warm; +palpable left radial pulse.  Good doppler signal in her left radial/ulnar and palmer arch.  She does have good ROM of her LUE.     A/P:  This is a 68 y.o. female here for f/u to 2nd stage BVT on 01/30/14 with c/o pain in her posterior arm and shoulder and occasional cramping in her left hand.  She has good doppler flow in her left radial artery even without compression of the fistula.  She also has good doppler flow in the palmer arch, therefore, Dr. Trula Slade does not believe this is related to steal symptoms.     She did inquire about wearing a sling-I discussed with her that this would be fine, but do not wear it continuously as we do not want her arm to freeze.  She will continue to hand exercises with a ball or sock.  Dr. Trula Slade will see her back in 2-3 weeks to re-evaluate.   Leontine Locket, PA-C [facility] [phone]  Clinic MD:  Pt seen and examined with Dr. Trula Slade  I agree with the above.  Patient has been seen and examined.  There is an excellent thrill within her basilic vein transposition.  The patient called last  week and was complaining of swelling around her antecubital incision which she  states has gotten better.  She complains of some numbness and shooting pain in her hand.  The patient does have on Doppler evaluation a biphasic radial ulnar and palmar arch signal.  I suspect the patient's complaints are not related to steal syndrome.  I have encouraged her to do exercises for strengthening.  She'll followup in 2-3 weeks for repeat evaluation.  Annamarie Major

## 2014-02-23 ENCOUNTER — Encounter: Payer: Medicare Other | Admitting: Surgery

## 2014-02-24 ENCOUNTER — Ambulatory Visit: Payer: Medicare Other | Admitting: *Deleted

## 2014-03-13 ENCOUNTER — Encounter: Payer: Self-pay | Admitting: Surgery

## 2014-03-16 ENCOUNTER — Ambulatory Visit (INDEPENDENT_AMBULATORY_CARE_PROVIDER_SITE_OTHER): Payer: Self-pay | Admitting: Surgery

## 2014-03-16 ENCOUNTER — Encounter: Payer: Self-pay | Admitting: Surgery

## 2014-03-16 VITALS — BP 198/79 | HR 70 | Ht 68.0 in | Wt 272.5 lb

## 2014-03-16 DIAGNOSIS — N186 End stage renal disease: Secondary | ICD-10-CM

## 2014-03-16 NOTE — Progress Notes (Signed)
The patient is back today for followup.  On 01/30/2014, she underwent a second stage left basilic vein transposition.  She states that she has had some drainage from each of her 2 incisions near where the knot was under the skin.  There was a small amount of suture retained on each incision which I removed.  There is an excellent thrill within the fistula.  The patient is not yet on dialysis, however I feel that her fistula is ready for use.  She'll followup with me on a when necessary basis.

## 2014-03-25 ENCOUNTER — Encounter: Payer: Medicare Other | Attending: Internal Medicine | Admitting: *Deleted

## 2014-03-25 ENCOUNTER — Encounter: Payer: Self-pay | Admitting: *Deleted

## 2014-03-25 VITALS — Ht 68.0 in | Wt 265.0 lb

## 2014-03-25 DIAGNOSIS — Z794 Long term (current) use of insulin: Secondary | ICD-10-CM | POA: Diagnosis not present

## 2014-03-25 DIAGNOSIS — E1129 Type 2 diabetes mellitus with other diabetic kidney complication: Secondary | ICD-10-CM | POA: Insufficient documentation

## 2014-03-25 DIAGNOSIS — I120 Hypertensive chronic kidney disease with stage 5 chronic kidney disease or end stage renal disease: Secondary | ICD-10-CM | POA: Diagnosis not present

## 2014-03-25 DIAGNOSIS — Z713 Dietary counseling and surveillance: Secondary | ICD-10-CM | POA: Insufficient documentation

## 2014-03-25 DIAGNOSIS — N186 End stage renal disease: Secondary | ICD-10-CM | POA: Diagnosis not present

## 2014-03-25 DIAGNOSIS — E118 Type 2 diabetes mellitus with unspecified complications: Secondary | ICD-10-CM

## 2014-03-25 NOTE — Progress Notes (Signed)
  Medical Nutrition Therapy:  Appt start time: 1400 end time:  8099.   Assessment:  Primary concerns today: 03/25/14. She has had Diabetes for about 35 years. Now with Renal complications and plans to start dialysis in the near future.. States her biggest stress is financial limitations. She SMBG 4 times a day and states her last A1c was 6.1%.  Preferred Learning Style:   No preference indicated   Learning Readiness:   Ready  Change in progress  MEDICATIONS: see list, diabetes medications are Novolin N, Novolin R, Victoza   DIETARY INTAKE:  24-hr recall:  B ( AM): Honey Bunches of Oats with soy milk, mini or thin bagel with light cream cheese, 1 cup decaf coffee or tea, Splenda and Hazelnut cream in coffee  Snk ( AM): no  L ( PM): left overs OR occasionally her main meal: meat, occasionally a starch, usually a vegetable, Crystal Light  Snk ( PM): Tortilla chips and salsa D ( PM): Protein drink for dinner if hot meal at lunch OR  Snk ( PM): no Beverages: decaf coffee, tea, Crystal Light  Usual physical activity: limited due to health issues  Estimated energy needs: 1400 calories 158 g carbohydrates 105 g protein 39 g fat  Progress Towards Goal(s):  In progress.   Nutritional Diagnosis:  NB-1.1 Food and nutrition-related knowledge deficit As related to Diabetes.  As evidenced by renal complications .    Intervention:  Nutrition counseling and diabetes education initiated. Discussed Carb Counting as method of portion control and in conjunction with renal diet modifications, reading food labels, and benefits of increased activity as tolerated.  Teaching Method Utilized: Visual, Auditory and Hands on  Handouts given during visit include:  [employer] Brochure  Carb Counting and Meal Card  Insulin Action handout  Barriers to learning/adherence to lifestyle change: obesity and impending dialysis  Demonstrated degree of understanding via:  Teach Back    Monitoring/Evaluation:  Dietary intake, exercise, reading food labels, and body weight prn.

## 2014-04-08 ENCOUNTER — Other Ambulatory Visit: Payer: Self-pay | Admitting: *Deleted

## 2014-04-08 MED ORDER — CARVEDILOL 25 MG PO TABS: 37.5000 mg | ORAL_TABLET | Freq: Two times a day (BID) | ORAL | Status: DC

## 2014-04-08 NOTE — Telephone Encounter (Signed)
Rx refill sent to patient pharmacy   

## 2014-04-14 ENCOUNTER — Other Ambulatory Visit (HOSPITAL_COMMUNITY): Payer: Self-pay | Admitting: *Deleted

## 2014-04-14 ENCOUNTER — Encounter (INDEPENDENT_AMBULATORY_CARE_PROVIDER_SITE_OTHER): Payer: Self-pay

## 2014-04-15 ENCOUNTER — Encounter (HOSPITAL_COMMUNITY)
Admission: RE | Admit: 2014-04-15 | Discharge: 2014-04-15 | Disposition: A | Discharge status: Home / Self Care | Payer: Medicare Other | Source: Ambulatory Visit | Attending: Nephrology | Admitting: Nephrology

## 2014-04-15 DIAGNOSIS — I129 Hypertensive chronic kidney disease with stage 1 through stage 4 chronic kidney disease, or unspecified chronic kidney disease: Secondary | ICD-10-CM | POA: Diagnosis present

## 2014-04-15 DIAGNOSIS — N184 Chronic kidney disease, stage 4 (severe): Secondary | ICD-10-CM | POA: Insufficient documentation

## 2014-04-15 DIAGNOSIS — D631 Anemia in chronic kidney disease: Secondary | ICD-10-CM | POA: Insufficient documentation

## 2014-04-15 DIAGNOSIS — N039 Chronic nephritic syndrome with unspecified morphologic changes: Principal | ICD-10-CM

## 2014-04-15 LAB — POCT HEMOGLOBIN-HEMACUE: Hemoglobin: 9.6 g/dL — ABNORMAL LOW (ref 12.0–15.0)

## 2014-04-15 MED ORDER — EPOETIN ALFA 20000 UNIT/ML IJ SOLN: 20000.0000 [IU] | INTRAMUSCULAR | Status: DC

## 2014-04-15 MED ORDER — SODIUM CHLORIDE 0.9 % IV SOLN
1020.0000 mg | Freq: Once | INTRAVENOUS | Status: AC
  Administered 2014-04-15: 1020 mg via INTRAVENOUS
  Filled 2014-04-15: qty 34

## 2014-04-15 MED ORDER — EPOETIN ALFA 20000 UNIT/ML IJ SOLN
INTRAMUSCULAR | Status: AC
  Administered 2014-04-15: 20000 [IU] via SUBCUTANEOUS
  Filled 2014-04-15: qty 1

## 2014-04-15 NOTE — Discharge Instructions (Signed)
Epoetin Alfa injection °What is this medicine? °EPOETIN ALFA (e POE e tin AL fa) helps your body make more red blood cells. This medicine is used to treat anemia caused by chronic kidney failure, cancer chemotherapy, or HIV-therapy. It may also be used before surgery if you have anemia. °This medicine may be used for other purposes; ask your health care provider or pharmacist if you have questions. °COMMON BRAND NAME(S): Epogen, Procrit °What should I tell my health care provider before I take this medicine? °They need to know if you have any of these conditions: °-blood clotting disorders °-cancer patient not on chemotherapy °-cystic fibrosis °-heart disease, such as angina or heart failure °-hemoglobin level of 12 g/dL or greater °-high blood pressure °-low levels of folate, iron, or vitamin B12 °-seizures °-an unusual or allergic reaction to erythropoietin, albumin, benzyl alcohol, hamster proteins, other medicines, foods, dyes, or preservatives °-pregnant or trying to get pregnant °-breast-feeding °How should I use this medicine? °This medicine is for injection into a vein or under the skin. It is usually given by a health care professional in a hospital or clinic setting. °If you get this medicine at home, you will be taught how to prepare and give this medicine. Use exactly as directed. Take your medicine at regular intervals. Do not take your medicine more often than directed. °It is important that you put your used needles and syringes in a special sharps container. Do not put them in a trash can. If you do not have a sharps container, call your pharmacist or healthcare provider to get one. °Talk to your pediatrician regarding the use of this medicine in children. While this drug may be prescribed for selected conditions, precautions do apply. °Overdosage: If you think you have taken too much of this medicine contact a poison control center or emergency room at once. °NOTE: This medicine is only for you. Do  not share this medicine with others. °What if I miss a dose? °If you miss a dose, take it as soon as you can. If it is almost time for your next dose, take only that dose. Do not take double or extra doses. °What may interact with this medicine? °Do not take this medicine with any of the following medications: °-darbepoetin alfa °This list may not describe all possible interactions. Give your health care provider a list of all the medicines, herbs, non-prescription drugs, or dietary supplements you use. Also tell them if you smoke, drink alcohol, or use illegal drugs. Some items may interact with your medicine. °What should I watch for while using this medicine? °Visit your prescriber or health care professional for regular checks on your progress and for the needed blood tests and blood pressure measurements. It is especially important for the doctor to make sure your hemoglobin level is in the desired range, to limit the risk of potential side effects and to give you the best benefit. Keep all appointments for any recommended tests. Check your blood pressure as directed. Ask your doctor what your blood pressure should be and when you should contact him or her. °As your body makes more red blood cells, you may need to take iron, folic acid, or vitamin B supplements. Ask your doctor or health care provider which products are right for you. If you have kidney disease continue dietary restrictions, even though this medication can make you feel better. Talk with your doctor or health care professional about the foods you eat and the vitamins that you take. °What   side effects may I notice from receiving this medicine? °Side effects that you should report to your doctor or health care professional as soon as possible: °-allergic reactions like skin rash, itching or hives, swelling of the face, lips, or tongue °-breathing problems °-changes in vision °-chest pain °-confusion, trouble speaking or understanding °-feeling  faint or lightheaded, falls °-high blood pressure °-muscle aches or pains °-pain, swelling, warmth in the leg °-rapid weight gain °-severe headaches °-sudden numbness or weakness of the face, arm or leg °-trouble walking, dizziness, loss of balance or coordination °-seizures (convulsions) °-swelling of the ankles, feet, hands °-unusually weak or tired °Side effects that usually do not require medical attention (report to your doctor or health care professional if they continue or are bothersome): °-diarrhea °-fever, chills (flu-like symptoms) °-headaches °-nausea, vomiting °-redness, stinging, or swelling at site where injected °This list may not describe all possible side effects. Call your doctor for medical advice about side effects. You may report side effects to FDA at 1-800-FDA-1088. °Where should I keep my medicine? °Keep out of the reach of children. °Store in a refrigerator between 2 and 8 degrees C (36 and 46 degrees F). Do not freeze or shake. Throw away any unused portion if using a single-dose vial. Multi-dose vials can be kept in the refrigerator for up to 21 days after the initial dose. Throw away unused medicine. °NOTE: This sheet is a summary. It may not cover all possible information. If you have questions about this medicine, talk to your doctor, pharmacist, or health care provider. °© 2015, [employer]. (2008-07-21 10:25:44) °Ferumoxytol injection °What is this medicine? °FERUMOXYTOL is an iron complex. Iron is used to make healthy red blood cells, which carry oxygen and nutrients throughout the body. This medicine is used to treat iron deficiency anemia in people with chronic kidney disease. °This medicine may be used for other purposes; ask your health care provider or pharmacist if you have questions. °COMMON BRAND NAME(S): Feraheme °What should I tell my health care provider before I take this medicine? °They need to know if you have any of these conditions: °-anemia not caused by  low iron levels °-high levels of iron in the blood °-magnetic resonance imaging (MRI) test scheduled °-an unusual or allergic reaction to iron, other medicines, foods, dyes, or preservatives °-pregnant or trying to get pregnant °-breast-feeding °How should I use this medicine? °This medicine is for injection into a vein. It is given by a health care professional in a hospital or clinic setting. °Talk to your pediatrician regarding the use of this medicine in children. Special care may be needed. °Overdosage: If you think you've taken too much of this medicine contact a poison control center or emergency room at once. °Overdosage: If you think you have taken too much of this medicine contact a poison control center or emergency room at once. °NOTE: This medicine is only for you. Do not share this medicine with others. °What if I miss a dose? °It is important not to miss your dose. Call your doctor or health care professional if you are unable to keep an appointment. °What may interact with this medicine? °This medicine may interact with the following medications: °-other iron products °This list may not describe all possible interactions. Give your health care provider a list of all the medicines, herbs, non-prescription drugs, or dietary supplements you use. Also tell them if you smoke, drink alcohol, or use illegal drugs. Some items may interact with your medicine. °What should I   watch for while using this medicine? °Visit your doctor or healthcare professional regularly. Tell your doctor or healthcare professional if your symptoms do not start to get better or if they get worse. You may need blood work done while you are taking this medicine. °You may need to follow a special diet. Talk to your doctor. Foods that contain iron include: whole grains/cereals, dried fruits, beans, or peas, leafy green vegetables, and organ meats (liver, kidney). °What side effects may I notice from receiving this medicine? °Side  effects that you should report to your doctor or health care professional as soon as possible: °-allergic reactions like skin rash, itching or hives, swelling of the face, lips, or tongue °-breathing problems °-changes in blood pressure °-feeling faint or lightheaded, falls °-fever or chills °-flushing, sweating, or hot feelings °-swelling of the ankles or feet °Side effects that usually do not require medical attention (Report these to your doctor or health care professional if they continue or are bothersome.): °-diarrhea °-headache °-nausea, vomiting °-stomach pain °This list may not describe all possible side effects. Call your doctor for medical advice about side effects. You may report side effects to FDA at 1-800-FDA-1088. °Where should I keep my medicine? °This drug is given in a hospital or clinic and will not be stored at home. °NOTE: This sheet is a summary. It may not cover all possible information. If you have questions about this medicine, talk to your doctor, pharmacist, or health care provider. °© 2015, [employer] Standard. (2012-03-22 15:23:36) ° ° °

## 2014-04-16 ENCOUNTER — Other Ambulatory Visit: Payer: Self-pay | Admitting: Family Medicine

## 2014-04-22 ENCOUNTER — Ambulatory Visit (INDEPENDENT_AMBULATORY_CARE_PROVIDER_SITE_OTHER): Payer: Medicare Other | Admitting: General Surgery

## 2014-04-22 ENCOUNTER — Other Ambulatory Visit (INDEPENDENT_AMBULATORY_CARE_PROVIDER_SITE_OTHER): Payer: Self-pay

## 2014-04-22 DIAGNOSIS — M7989 Other specified soft tissue disorders: Secondary | ICD-10-CM

## 2014-04-22 DIAGNOSIS — M25559 Pain in unspecified hip: Secondary | ICD-10-CM

## 2014-04-22 DIAGNOSIS — D171 Benign lipomatous neoplasm of skin and subcutaneous tissue of trunk: Secondary | ICD-10-CM

## 2014-04-22 DIAGNOSIS — R2243 Localized swelling, mass and lump, lower limb, bilateral: Secondary | ICD-10-CM

## 2014-04-23 ENCOUNTER — Other Ambulatory Visit: Payer: Self-pay | Admitting: Family Medicine

## 2014-04-28 ENCOUNTER — Ambulatory Visit
Admission: RE | Admit: 2014-04-28 | Discharge: 2014-04-28 | Disposition: A | Discharge status: Home / Self Care | Payer: Medicare Other | Source: Ambulatory Visit | Attending: General Surgery | Admitting: General Surgery

## 2014-04-28 DIAGNOSIS — M25559 Pain in unspecified hip: Secondary | ICD-10-CM

## 2014-04-28 DIAGNOSIS — D171 Benign lipomatous neoplasm of skin and subcutaneous tissue of trunk: Secondary | ICD-10-CM

## 2014-04-28 DIAGNOSIS — R2243 Localized swelling, mass and lump, lower limb, bilateral: Secondary | ICD-10-CM

## 2014-04-28 DIAGNOSIS — M7989 Other specified soft tissue disorders: Secondary | ICD-10-CM

## 2014-05-13 ENCOUNTER — Encounter (HOSPITAL_COMMUNITY)
Admission: RE | Admit: 2014-05-13 | Discharge: 2014-05-13 | Disposition: A | Discharge status: Home / Self Care | Payer: Medicare Other | Source: Ambulatory Visit | Attending: Nephrology | Admitting: Nephrology

## 2014-05-13 DIAGNOSIS — I129 Hypertensive chronic kidney disease with stage 1 through stage 4 chronic kidney disease, or unspecified chronic kidney disease: Secondary | ICD-10-CM | POA: Diagnosis present

## 2014-05-13 DIAGNOSIS — N184 Chronic kidney disease, stage 4 (severe): Secondary | ICD-10-CM | POA: Insufficient documentation

## 2014-05-13 DIAGNOSIS — D631 Anemia in chronic kidney disease: Secondary | ICD-10-CM | POA: Insufficient documentation

## 2014-05-13 DIAGNOSIS — N039 Chronic nephritic syndrome with unspecified morphologic changes: Principal | ICD-10-CM

## 2014-05-13 LAB — RENAL FUNCTION PANEL
ALBUMIN: 3.1 g/dL — AB (ref 3.5–5.2)
ANION GAP: 14 (ref 5–15)
BUN: 59 mg/dL — ABNORMAL HIGH (ref 6–23)
CHLORIDE: 102 meq/L (ref 96–112)
CO2: 26 meq/L (ref 19–32)
Calcium: 9.5 mg/dL (ref 8.4–10.5)
Creatinine, Ser: 2.31 mg/dL — ABNORMAL HIGH (ref 0.50–1.10)
GFR, EST AFRICAN AMERICAN: 24 mL/min — AB (ref >90)
GFR, EST NON AFRICAN AMERICAN: 21 mL/min — AB (ref >90)
Glucose, Bld: 84 mg/dL (ref 70–99)
POTASSIUM: 3.7 meq/L (ref 3.7–5.3)
Phosphorus: 4.6 mg/dL (ref 2.3–4.6)
SODIUM: 142 meq/L (ref 137–147)

## 2014-05-13 LAB — IRON AND TIBC
IRON: 54 ug/dL (ref 42–135)
Saturation Ratios: 19 % — ABNORMAL LOW (ref 20–55)
TIBC: 286 ug/dL (ref 250–470)
UIBC: 232 ug/dL (ref 125–400)

## 2014-05-13 LAB — POCT HEMOGLOBIN-HEMACUE: HEMOGLOBIN: 9.9 g/dL — AB (ref 12.0–15.0)

## 2014-05-13 LAB — FERRITIN: Ferritin: 503 ng/mL — ABNORMAL HIGH (ref 10–291)

## 2014-05-13 MED ORDER — EPOETIN ALFA 20000 UNIT/ML IJ SOLN
INTRAMUSCULAR | Status: AC
  Filled 2014-05-13: qty 1

## 2014-05-13 MED ORDER — EPOETIN ALFA 20000 UNIT/ML IJ SOLN
20000.0000 [IU] | INTRAMUSCULAR | Status: DC
  Administered 2014-05-13: 20000 [IU] via SUBCUTANEOUS

## 2014-05-14 LAB — PTH, INTACT AND CALCIUM
Calcium, Total (PTH): 9.5 mg/dL (ref 8.4–10.5)
PTH: 166 pg/mL — ABNORMAL HIGH (ref 14–64)

## 2014-05-15 ENCOUNTER — Other Ambulatory Visit: Payer: Self-pay | Admitting: Family Medicine

## 2014-06-02 ENCOUNTER — Encounter: Payer: Self-pay | Admitting: Cardiovascular Disease

## 2014-06-05 ENCOUNTER — Other Ambulatory Visit: Payer: Self-pay | Admitting: Family Medicine

## 2014-06-10 ENCOUNTER — Encounter: Payer: Self-pay | Admitting: Cardiovascular Disease

## 2014-06-10 ENCOUNTER — Ambulatory Visit (INDEPENDENT_AMBULATORY_CARE_PROVIDER_SITE_OTHER): Payer: Medicare Other | Admitting: Cardiovascular Disease

## 2014-06-10 ENCOUNTER — Encounter (HOSPITAL_COMMUNITY)
Admission: RE | Admit: 2014-06-10 | Discharge: 2014-06-10 | Disposition: A | Discharge status: Home / Self Care | Payer: Medicare Other | Source: Ambulatory Visit | Attending: Nephrology | Admitting: Nephrology

## 2014-06-10 VITALS — BP 179/82 | HR 67 | Ht 68.0 in | Wt 275.7 lb

## 2014-06-10 DIAGNOSIS — I10 Essential (primary) hypertension: Secondary | ICD-10-CM

## 2014-06-10 DIAGNOSIS — E118 Type 2 diabetes mellitus with unspecified complications: Secondary | ICD-10-CM

## 2014-06-10 DIAGNOSIS — D631 Anemia in chronic kidney disease: Secondary | ICD-10-CM | POA: Diagnosis not present

## 2014-06-10 DIAGNOSIS — G4733 Obstructive sleep apnea (adult) (pediatric): Secondary | ICD-10-CM

## 2014-06-10 DIAGNOSIS — N184 Chronic kidney disease, stage 4 (severe): Secondary | ICD-10-CM

## 2014-06-10 DIAGNOSIS — I209 Angina pectoris, unspecified: Secondary | ICD-10-CM

## 2014-06-10 DIAGNOSIS — R079 Chest pain, unspecified: Secondary | ICD-10-CM

## 2014-06-10 DIAGNOSIS — E038 Other specified hypothyroidism: Secondary | ICD-10-CM

## 2014-06-10 DIAGNOSIS — E669 Obesity, unspecified: Secondary | ICD-10-CM

## 2014-06-10 DIAGNOSIS — N189 Chronic kidney disease, unspecified: Secondary | ICD-10-CM

## 2014-06-10 DIAGNOSIS — I25118 Atherosclerotic heart disease of native coronary artery with other forms of angina pectoris: Secondary | ICD-10-CM

## 2014-06-10 DIAGNOSIS — I2510 Atherosclerotic heart disease of native coronary artery without angina pectoris: Secondary | ICD-10-CM

## 2014-06-10 LAB — RENAL FUNCTION PANEL
ANION GAP: 14 (ref 5–15)
Albumin: 3.2 g/dL — ABNORMAL LOW (ref 3.5–5.2)
BUN: 62 mg/dL — ABNORMAL HIGH (ref 6–23)
CALCIUM: 10.3 mg/dL (ref 8.4–10.5)
CO2: 29 meq/L (ref 19–32)
Chloride: 99 mEq/L (ref 96–112)
Creatinine, Ser: 2.39 mg/dL — ABNORMAL HIGH (ref 0.50–1.10)
GFR, EST AFRICAN AMERICAN: 23 mL/min — AB (ref >90)
GFR, EST NON AFRICAN AMERICAN: 20 mL/min — AB (ref >90)
GLUCOSE: 210 mg/dL — AB (ref 70–99)
POTASSIUM: 3.8 meq/L (ref 3.7–5.3)
Phosphorus: 4.5 mg/dL (ref 2.3–4.6)
Sodium: 142 mEq/L (ref 137–147)

## 2014-06-10 LAB — FERRITIN: Ferritin: 226 ng/mL (ref 10–291)

## 2014-06-10 LAB — IRON AND TIBC
Iron: 64 ug/dL (ref 42–135)
SATURATION RATIOS: 19 % — AB (ref 20–55)
TIBC: 337 ug/dL (ref 250–470)
UIBC: 273 ug/dL (ref 125–400)

## 2014-06-10 LAB — POCT HEMOGLOBIN-HEMACUE: Hemoglobin: 10.2 g/dL — ABNORMAL LOW (ref 12.0–15.0)

## 2014-06-10 MED ORDER — EPOETIN ALFA 20000 UNIT/ML IJ SOLN: 20000.0000 [IU] | INTRAMUSCULAR | Status: DC

## 2014-06-10 MED ORDER — EPOETIN ALFA 20000 UNIT/ML IJ SOLN
INTRAMUSCULAR | Status: AC
  Administered 2014-06-10: 20000 [IU] via SUBCUTANEOUS
  Filled 2014-06-10: qty 1

## 2014-06-10 MED ORDER — ISOSORBIDE MONONITRATE ER 30 MG PO TB24: 30.0000 mg | ORAL_TABLET | Freq: Every day | ORAL | Status: DC

## 2014-06-10 NOTE — Progress Notes (Signed)
Patient ID: Donna Sweeney, female   DOB: 02/04/1946, 68 y.o.   MRN: [id number]       HPI: Donna Sweeney is a 68 y.o. African American female who presents for a 9 month cardiology evaluation.  [name] has a history of obesity, long-standing history of hypertension, congestive heart failure and while she lived in [place] was enrolled in a heart failure clinic. She has a > 30 year history of diabetes mellitus,  a history of ventral hernia, as well as severe obstructive sleep apnea for which he utilizes CPAP therapy. An echo Doppler study in February 2014 showed moderate LVH with an ejection fraction at 60-65%,  grade 1 diastolic dysfunction and his abnormal tissue Doppler E/e' ratio greater than 20 suggesting markedly elevated LV filling pressures. She did have severe LA dilatation by volume assessment and  very mild pulmonary hypertension.  She has renal insufficiency and this has progressed from grade 3 to grade 4. Her GFR had reduced down to a nadir of 16 and is now approximately 22. She continues to have some difficulty with hypertension and Dr. Moshe Cipro had recently increased her Diovan from 160-320 mg daily. The patient tells me when she goes to pain management at times her blood pressure has ranged from 761-950 systolically. She does note shortness of breath with walking.  Since I last saw her, she underwent left arm first stage basilic vein transposition on 12/05/2013 and in June she underwent completion of her AV fistula.  She has not yet instituted dialysis.  She tells me her GFR is now 22.  [name] does use her CPAP 100% of the time with reference to her severe obstructive sleep apnea.  She has experienced episodes of chest tightness, radiating to her jaw and also some mild shortness of breath.  She continues to have difficulty with her leg edema.  She presents for evaluation.  Past Medical History  Diagnosis Date  . Hypertension   . CHF (congestive heart  failure)   . Constipation   . Hypothyroidism   . GERD (gastroesophageal reflux disease)   . Hyperlipidemia   . Hx of seasonal allergies   . Fibromyalgia   . Arthritis   . Angina at rest   . Asthma   . Diabetes mellitus   . History of cardiac cath [phone]    non obstructive CAD (done in [place]) EF 55%  . Herniated cervical disc   . Sciatica   . Diverticulitis   . Ulcerative colitis   . CAD (coronary artery disease)     non obstructive disease  . Biventricular heart failure   . Echocardiogram abnormal 10/17/12    EF 98-33%, grade I diastolic dysfunction, trivial MR, severely dilated LA  . Myocardial infarction   . Peripheral vascular disease   . Anemia   . Chronic kidney disease     stage 4 chronic kidney disease, lasr saw dr Debroah Baller  . Achilles tendon rupture 07-01-2013    right, wears boot to knee  . Bruises easily     both legs  . OSA (obstructive sleep apnea)     cpap setting of 9  . DVT (deep venous thrombosis)   . Heart murmur   . H/O hiatal hernia     Past Surgical History  Procedure Laterality Date  . Tonsillectomy    . Dilation and curettage of uterus    . Cholecystectomy    . Oophorectomy    . Cystectomy    . Knee arthroscopy    .  Replacement total knee      right  . Foot surgery      right  . Bunionectomy      right  . Hammer toe surgery      right  . Mole removal      right 5th toe  . Foot surgery      joint removed from great toe and pin inserted  . Cardiac catheterization  2013, 2005, 1999    non obstructive CAD  . Abdominal hysterectomy    . Revision total knee arthroplasty Right 2009    had hematoma afterwards  . Esophagogastroduodenoscopy (egd) with propofol N/A 09/04/2013    Procedure: ESOPHAGOGASTRODUODENOSCOPY (EGD) WITH PROPOFOL;  Surgeon: Lear Ng, MD;  Location: [facility];  Service: Endoscopy;  Laterality: N/A;  . Colonoscopy with propofol N/A 09/04/2013    Procedure: COLONOSCOPY WITH PROPOFOL;  Surgeon:  Lear Ng, MD;  Location: [facility];  Service: Endoscopy;  Laterality: N/A;  . Bascilic vein transposition Left 12/05/2013    Procedure: First Stage Bascilic Vein Transpostion;  Surgeon: Serafina Mitchell, MD;  Location: [place];  Service: Vascular;  Laterality: Left;  . Bascilic vein transposition Left 01/30/2014    DR Moss Mc  . Bascilic vein transposition Left 01/30/2014    Procedure: SECOND STAGE [place];  Surgeon: Serafina Mitchell, MD;  Location: [place];  Service: Vascular;  Laterality: Left;    Allergies  Allergen Reactions  . Ivp Dye [Iodinated Diagnostic Agents] Shortness Of Breath, Itching and Swelling    Only reaction with IV Dye.  Pt. states iodine on the skin has never caused reaction.   Marland Kitchen Raspberry Shortness Of Breath, Itching and Swelling    Current Outpatient Prescriptions  Medication Sig Dispense Refill  . albuterol (PROVENTIL HFA;VENTOLIN HFA) 108 (90 BASE) MCG/ACT inhaler Inhale 2 puffs into the lungs 2 (two) times daily as needed for shortness of breath. For shortness of breath      . allopurinol (ZYLOPRIM) 100 MG tablet Take 100 mg by mouth daily.      Marland Kitchen aspirin 81 MG chewable tablet Chew 81 mg by mouth every morning.       . calcium-vitamin D (OSCAL WITH D) 500-200 MG-UNIT per tablet Take 1 tablet by mouth 2 (two) times daily.      . carvedilol (COREG) 25 MG tablet Take 1.5-2 tablets (37.5-50 mg total) by mouth 2 (two) times daily with a meal. Take 2 tablets in the morning and 1.5 tablets in the evening  120 tablet  2  . cholecalciferol (VITAMIN D) 1000 UNITS tablet Take 1,000 Units by mouth daily.      Marland Kitchen COLCRYS 0.6 MG tablet Take 1 tablet by mouth 2 (two) times daily.      . DULoxetine (CYMBALTA) 60 MG capsule Take 60 mg by mouth every evening.       Marland Kitchen EPINEPHrine 0.3 mg/0.3 mL IJ SOAJ injection Inject 0.3 mg into the muscle once as needed (severe allergic reaction).      . fish oil-omega-3 fatty acids 1000 MG capsule Take 1 g by mouth 3  (three) times daily.      . fluticasone (FLONASE) 50 MCG/ACT nasal spray Place 2 sprays into the nose daily.      . furosemide (LASIX) 80 MG tablet Take 40-80 mg by mouth 2 (two) times daily. Take one tablet in the morning and half tablet at lunch time      . gabapentin (NEURONTIN) 300 MG capsule Take 300 mg  by mouth 3 (three) times daily.      . hydrALAZINE (APRESOLINE) 50 MG tablet Take 50 mg by mouth 3 (three) times daily.      Marland Kitchen lactulose, encephalopathy, (GENERLAC) 10 GM/15ML SOLN Take 10 g by mouth 2 (two) times daily.       Marland Kitchen levothyroxine (SYNTHROID, LEVOTHROID) 100 MCG tablet Take 100 mcg by mouth daily before breakfast.       . nitroGLYCERIN (NITROSTAT) 0.4 MG SL tablet Place 0.4 mg under the tongue every 5 (five) minutes as needed for chest pain. For chest pain      . NOVOLIN N RELION 100 UNIT/ML injection Inject 20 Units into the skin 2 (two) times daily before a meal.       . NOVOLIN R 100 UNIT/ML injection Inject 5 Units into the skin as directed.      Marland Kitchen oxyCODONE-acetaminophen (PERCOCET/ROXICET) 5-325 MG per tablet Take 1 tablet by mouth every 6 (six) hours as needed for severe pain.      Marland Kitchen oxyCODONE-acetaminophen (PERCOCET/ROXICET) 5-325 MG per tablet Take 1 tablet by mouth every 6 (six) hours as needed for severe pain.  30 tablet  0  . pantoprazole (PROTONIX) 20 MG tablet Take 20 mg by mouth 2 (two) times daily.      . potassium chloride SA (K-DUR,KLOR-CON) 20 MEQ tablet       . RELION INSULIN SYRINGE 1ML/31G 31G X 5/16" 1 ML MISC       . simvastatin (ZOCOR) 40 MG tablet Take 40 mg by mouth every evening.      . SYMBICORT 80-4.5 MCG/ACT inhaler Inhale 2 puffs into the lungs 2 (two) times daily.      Marland Kitchen tiZANidine (ZANAFLEX) 2 MG tablet Take 2 mg by mouth 3 (three) times daily with meals.      . traZODone (DESYREL) 50 MG tablet Take 50 mg by mouth at bedtime.       . valsartan (DIOVAN) 320 MG tablet Take 320 mg by mouth at bedtime.      Marland Kitchen VICTOZA 18 MG/3ML SOPN Inject 1.2 Units  into the skin every morning.        No current facility-administered medications for this visit.   Facility-Administered Medications Ordered in Other Visits  Medication Dose Route Frequency Provider Last Rate Last Dose  . epoetin alfa (EPOGEN,PROCRIT) injection 20,000 Units  20,000 Units Subcutaneous Q28 days Louis Meckel, MD        History   Social History  . Marital Status: Divorced    Spouse Name: N/A    Number of Children: N/A  . Years of Education: N/A   Occupational History  . Not on file.   Social History Main Topics  . Smoking status: Never Smoker   . Smokeless tobacco: Never Used  . Alcohol Use: No  . Drug Use: No  . Sexual Activity: No   Other Topics Concern  . Not on file   Social History Narrative  . No narrative on file    Family History  Problem Relation Age of Onset  . Diabetes type II Mother   . Hypertension Mother   . Hyperlipidemia Mother   . Kidney disease Mother   . Fibroids Mother   . Diabetes Mother   . Heart disease Mother   . Varicose Veins Mother   . Peripheral vascular disease Mother   . Hypertension Sister   . Hypertension Brother   . Hypertension Sister   . Hypertension Sister    ROS  General: Negative; No fevers, chills, or night sweats;  HEENT: Negative; No changes in vision or hearing, sinus congestion, difficulty swallowing Pulmonary: Negative; No cough, wheezing, shortness of breath, hemoptysis Cardiovascular: See history of present illness Positive for leg swelling GI: Negative; No nausea, vomiting, diarrhea, or abdominal pain GU: Negative; No dysuria, hematuria, or difficulty voiding Musculoskeletal: History of ruptured Achilles tendon ;no myalgias, joint pain, or weakness Hematologic/Oncology: Negative; no easy bruising, bleeding Endocrine: Positive for diabetes mellitus, and hypothyroidism Neuro: Negative; no changes in balance, headaches Skin: Negative; No rashes or skin lesions Psychiatric: Negative; No  behavioral problems, depression Sleep: Positive for obstructive sleep apnea on CPAP with 100% compliance No snoring, daytime sleepiness, hypersomnolence, bruxism, restless legs, hypnogognic hallucinations, no cataplexy Other comprehensive 14 point system review is negative.    PE BP 179/82  Pulse 67  Ht 5\' 8"  (1.727 m)  Wt 275 lb 11.2 oz (125.057 kg)  BMI 41.93 kg/m2   repeat blood pressure 160/84 General: Alert, oriented, no distress.  Skin: normal turgor, no rashes HEENT: Normocephalic, atraumatic. Pupils round and reactive; sclera anicteric;no lid lag.  Nose without nasal septal hypertrophy Mouth/Parynx benign; Mallinpatti scale 3/4 Neck: No JVD, no carotid bruits with normal carotid upstrokes. Chest wall: No tenderness to palpation to Lungs: clear to ausculatation and percussion; no wheezing or rales Heart: RRR, s1 s2 normal 1/6 systolic murmur; no S3 gallop. No rub. Abdomen: Moderate central adiposity with question diastases recti versus a hernia.soft, nontender; no hepatosplenomehaly, BS+; abdominal aorta nontender and not dilated by palpation. Pulses 2+ Extremities:  Mild edema ; no clubbing cyanosis, Homan's sign negative  Neurologic: grossly nonfocal; cranial nerves grossly normal. Psychological: Normal affect and mood  ECG (independently read by me): Normal sinus rhythm at 67 beats per minute.  LVH by voltage criteria. Mild first-degree AV block with a PR interval at 210 ms.  QTc interval 450 ms.  January 2015 ECG (independently read by me) sinus rhythm with premature atrial complexes. LVH by voltage criteria.  Prior ECG 05/28/2013: Sinus rhythm at 77 beats per minute with sinus arrhythmia and occasional PVCs.  LABS:  BMET    Component Value Date/Time   NA 142 06/10/2014 0956     Hepatic Function Panel     Component Value Date/Time   PROT 6.7 01/19/2014 0152     CBC    Component Value Date/Time   WBC 12.2* 01/29/2014 1205     BNP    Component Value  Date/Time   PROBNP 1524.0* 12/24/2013 0055    Lipid Panel  No results found for this basename: chol,  trig,  hdl,  cholhdl,  vldl,  ldlcalc     RADIOLOGY: No results found.    ASSESSMENT AND PLAN: [name] is a 68 year old African American female with a history of morbid obesity with a BMI of 41.93, hypertension with documented normal systolic function with grade 1 diastolic dysfunction with elevated filling pressures by tissue Doppler.  She does have significant renal insufficiency which has progressed to class IV and is now status post AV fistula development in her left forearm, which is maturing but is not yet in use.  She now is on furosemide 80 mg twice a day, hydralazine 50 mg 3 times a day carvedilol 50 mg in the morning and 37.5 mg in the evening, as well as valsartan 320 mg daily for blood pressure control.  She is on ARB therapy.  At maximum dose in an attempt to delay the progression to end-stage renal  disease and is followed by Dr. Moshe Cipro.  She has experienced episodes of chest discomfort.  She tells me she has undergone prior cardiac catheterizations while living in [place] with her last one being in June 2013.  At that time she was told having mild blockage.  I will initiate therapy with isosorbide mononitrate 30 mg.  I will schedule her for a Lexiscan Myoview study to assess for ischemia.  She will  continue to take her simvastatin for hyperlipidemia. Laboratory be reassessed.  I will see her back in the office in 6 weeks for reevaluation.   Troy Sine, MD, [facility]  06/10/2014 5:08 PM

## 2014-06-10 NOTE — Patient Instructions (Addendum)
Your physician has requested that you have a lexiscan myoview. For further information please visit [email or web address]. Please follow instruction sheet, as given.  Your physician has recommended you make the following change in your medication: start new prescription for isosorbide 30 mg. This has already been sent to your pharmacy.  Your physician recommends that you schedule a follow-up appointment in: 6 weeks.

## 2014-06-11 LAB — PTH, INTACT AND CALCIUM
CALCIUM TOTAL (PTH): 9.9 mg/dL (ref 8.4–10.5)
PTH: 134 pg/mL — AB (ref 14–64)

## 2014-06-12 ENCOUNTER — Encounter: Payer: Self-pay | Admitting: Cardiovascular Disease

## 2014-06-12 DIAGNOSIS — R079 Chest pain, unspecified: Secondary | ICD-10-CM | POA: Insufficient documentation

## 2014-06-15 ENCOUNTER — Other Ambulatory Visit: Payer: Self-pay | Admitting: Family Medicine

## 2014-06-22 ENCOUNTER — Encounter: Payer: Self-pay | Admitting: Cardiovascular Disease

## 2014-06-23 ENCOUNTER — Telehealth (HOSPITAL_COMMUNITY): Payer: Self-pay

## 2014-06-23 NOTE — Telephone Encounter (Signed)
Encounter complete. 

## 2014-06-24 ENCOUNTER — Telehealth (HOSPITAL_COMMUNITY): Payer: Self-pay

## 2014-06-24 NOTE — Telephone Encounter (Signed)
Encounter complete. 

## 2014-06-25 ENCOUNTER — Ambulatory Visit (HOSPITAL_COMMUNITY)
Admission: RE | Admit: 2014-06-25 | Discharge: 2014-06-25 | Disposition: A | Discharge status: Home / Self Care | Payer: Medicare Other | Source: Ambulatory Visit | Attending: Cardiology | Admitting: Cardiology

## 2014-06-25 DIAGNOSIS — E785 Hyperlipidemia, unspecified: Secondary | ICD-10-CM | POA: Diagnosis not present

## 2014-06-25 DIAGNOSIS — I2510 Atherosclerotic heart disease of native coronary artery without angina pectoris: Secondary | ICD-10-CM | POA: Insufficient documentation

## 2014-06-25 DIAGNOSIS — Z833 Family history of diabetes mellitus: Secondary | ICD-10-CM | POA: Diagnosis not present

## 2014-06-25 DIAGNOSIS — R079 Chest pain, unspecified: Secondary | ICD-10-CM

## 2014-06-25 DIAGNOSIS — N181 Chronic kidney disease, stage 1: Secondary | ICD-10-CM | POA: Diagnosis not present

## 2014-06-25 DIAGNOSIS — I5189 Other ill-defined heart diseases: Secondary | ICD-10-CM | POA: Diagnosis not present

## 2014-06-25 DIAGNOSIS — I517 Cardiomegaly: Secondary | ICD-10-CM | POA: Diagnosis not present

## 2014-06-25 DIAGNOSIS — J45909 Unspecified asthma, uncomplicated: Secondary | ICD-10-CM | POA: Diagnosis not present

## 2014-06-25 DIAGNOSIS — Z8249 Family history of ischemic heart disease and other diseases of the circulatory system: Secondary | ICD-10-CM | POA: Diagnosis not present

## 2014-06-25 MED ORDER — AMINOPHYLLINE 25 MG/ML IV SOLN
100.0000 mg | Freq: Once | INTRAVENOUS | Status: AC
  Administered 2014-06-25: 100 mg via INTRAVENOUS

## 2014-06-25 MED ORDER — TECHNETIUM TC 99M SESTAMIBI GENERIC - CARDIOLITE
30.0000 | Freq: Once | INTRAVENOUS | Status: AC | PRN
  Administered 2014-06-25: 30 via INTRAVENOUS

## 2014-06-25 MED ORDER — REGADENOSON 0.4 MG/5ML IV SOLN
0.4000 mg | Freq: Once | INTRAVENOUS | Status: AC
  Administered 2014-06-25: 0.4 mg via INTRAVENOUS

## 2014-06-25 MED ORDER — TECHNETIUM TC 99M SESTAMIBI GENERIC - CARDIOLITE
10.0000 | Freq: Once | INTRAVENOUS | Status: AC | PRN
  Administered 2014-06-25: 10 via INTRAVENOUS

## 2014-06-25 NOTE — Procedures (Addendum)
[facility] [address] [place] [address] [phone]  Cardiology Nuclear Med Study  Donna Sweeney is a 68 y.o. female     MRN : [id number]     DOB: Apr 22, 1946  Procedure Date: 06/25/2014  Nuclear Med Background Indication for Stress Test:  Evaluation for Ischemia and Abnormal EKG History:  Asthma and CAD;MI;CATH'S X3--1999;2005;2013-Nonobstructive disease;CHF;Diastolic dysfunction Gr.1;Chronic renal insufficiency iv;No prior NUC MPI for comparison noted in epic;ECHO 09/2012-LVEF-60-65% Cardiac Risk Factors: Family History - CAD, Hypertension, IDDM Type 2, Lipids and Obesity  Symptoms:  Chest Pain, DOE, Fatigue, Light-Headedness and Palpitations   Nuclear Pre-Procedure Caffeine/Decaff Intake:  9:00pm NPO After: 7:00am   IV Site: R Forearm  IV 0.9% NS with Angio Cath:  22g  Chest Size (in):  n/a IV Started by: Rolene Course, RN  Height: 5\' 8"  (1.727 m)  Cup Size: D  BMI:  Body mass index is 41.82 kg/(m^2). Weight:  275 lb (124.739 kg)   Tech Comments:  n/a    Nuclear Med Study 1 or 2 day study: 1 day  Stress Test Type:  [place] Provider:  Shelva Majestic, MD   Resting Radionuclide: Technetium 34m Sestamibi  Resting Radionuclide Dose: 10.6 mCi   Stress Radionuclide:  Technetium 57m Sestamibi  Stress Radionuclide Dose: 30.4 mCi           Stress Protocol Rest HR: 61 Stress HR:81  Rest BP: 143/74 Stress BP: 155/62  Exercise Time (min): n/a METS: n/a          Dose of Adenosine (mg):  n/a Dose of Lexiscan: 0.4 mg  Dose of Atropine (mg): n/a Dose of Dobutamine: n/a mcg/kg/min (at max HR)  Stress Test Technologist: Mellody Memos, CCT Nuclear Technologist: Otho Perl, CNMT   Rest Procedure:  Myocardial perfusion imaging was performed at rest 45 minutes following the intravenous administration of Technetium 29m Sestamibi. Stress Procedure:  The patient received IV Lexiscan 0.4 mg over  15-seconds.  Technetium 7m Sestamibi injected IV at 30-seconds.  Patient experienced chest pressure, shortness of breath and was given 100 mg of Aminophylline IV. There were no significant changes with Lexiscan.  Quantitative spect images were obtained after a 45 minute delay.  Transient Ischemic Dilatation (Normal <1.22):  1.18 QGS EDV:  169 ml QGS ESV:  86 ml LV Ejection Fraction: 49%        Rest ECG: NSR-LVH  Stress ECG: No significant ST segment change suggestive of ischemia.  QPS Raw Data Images:  Acquisition technically good; LVE. Stress Images:  There is decreased uptake in the anterior wall. Rest Images:  There is decreased uptake in the anterior wall. Subtraction (SDS):  No evidence of ischemia.  Impression Exercise Capacity:  Lexiscan with no exercise. BP Response:  Normal blood pressure response. Clinical Symptoms:  There is chest pain. ECG Impression:  No significant ST segment change suggestive of ischemia. Comparison with Prior Nuclear Study: No previous nuclear study performed  Overall Impression:  Low risk stress nuclear study with a medium, moderate intensity, fixed anterior defect consistent with soft tissue attenuation; no ischemia.  LV Wall Motion:  Mild LVE with mild globel hypokinesis.   Kirk Ruths, MD  06/25/2014 1:13 PM

## 2014-06-26 ENCOUNTER — Other Ambulatory Visit: Payer: Self-pay

## 2014-06-26 MED ORDER — CARVEDILOL 25 MG PO TABS: 37.5000 mg | ORAL_TABLET | Freq: Two times a day (BID) | ORAL | Status: DC

## 2014-06-26 NOTE — Telephone Encounter (Signed)
Rx sent to pharmacy   

## 2014-07-07 ENCOUNTER — Other Ambulatory Visit (HOSPITAL_COMMUNITY): Payer: Self-pay | Admitting: *Deleted

## 2014-07-08 ENCOUNTER — Encounter (HOSPITAL_COMMUNITY)
Admission: RE | Admit: 2014-07-08 | Discharge: 2014-07-08 | Disposition: A | Discharge status: Home / Self Care | Payer: Medicare Other | Source: Ambulatory Visit | Attending: Nephrology | Admitting: Nephrology

## 2014-07-08 DIAGNOSIS — N184 Chronic kidney disease, stage 4 (severe): Secondary | ICD-10-CM | POA: Diagnosis not present

## 2014-07-08 DIAGNOSIS — D631 Anemia in chronic kidney disease: Secondary | ICD-10-CM | POA: Diagnosis not present

## 2014-07-08 LAB — RENAL FUNCTION PANEL
Albumin: 3.2 g/dL — ABNORMAL LOW (ref 3.5–5.2)
Anion gap: 16 — ABNORMAL HIGH (ref 5–15)
BUN: 55 mg/dL — ABNORMAL HIGH (ref 6–23)
CALCIUM: 10 mg/dL (ref 8.4–10.5)
CHLORIDE: 99 meq/L (ref 96–112)
CO2: 28 meq/L (ref 19–32)
Creatinine, Ser: 2.32 mg/dL — ABNORMAL HIGH (ref 0.50–1.10)
GFR, EST AFRICAN AMERICAN: 24 mL/min — AB (ref >90)
GFR, EST NON AFRICAN AMERICAN: 21 mL/min — AB (ref >90)
Glucose, Bld: 154 mg/dL — ABNORMAL HIGH (ref 70–99)
Phosphorus: 4.4 mg/dL (ref 2.3–4.6)
Potassium: 3.9 mEq/L (ref 3.7–5.3)
SODIUM: 143 meq/L (ref 137–147)

## 2014-07-08 LAB — IRON AND TIBC
IRON: 57 ug/dL (ref 42–135)
Saturation Ratios: 18 % — ABNORMAL LOW (ref 20–55)
TIBC: 318 ug/dL (ref 250–470)
UIBC: 261 ug/dL (ref 125–400)

## 2014-07-08 LAB — POCT HEMOGLOBIN-HEMACUE: HEMOGLOBIN: 10.6 g/dL — AB (ref 12.0–15.0)

## 2014-07-08 LAB — FERRITIN: Ferritin: 212 ng/mL (ref 10–291)

## 2014-07-08 MED ORDER — EPOETIN ALFA 20000 UNIT/ML IJ SOLN
20000.0000 [IU] | INTRAMUSCULAR | Status: DC
Start: 2014-07-08 — End: 2014-07-09
  Administered 2014-07-08: 20000 [IU] via SUBCUTANEOUS

## 2014-07-08 MED ORDER — FERUMOXYTOL INJECTION 510 MG/17 ML
1020.0000 mg | Freq: Once | INTRAVENOUS | Status: AC
  Administered 2014-07-08: 1020 mg via INTRAVENOUS
  Filled 2014-07-08: qty 34

## 2014-07-08 MED ORDER — EPOETIN ALFA 20000 UNIT/ML IJ SOLN
INTRAMUSCULAR | Status: AC
  Filled 2014-07-08: qty 1

## 2014-07-09 LAB — PTH, INTACT AND CALCIUM
Calcium, Total (PTH): 9.8 mg/dL (ref 8.4–10.5)
PTH: 137 pg/mL — ABNORMAL HIGH (ref 14–64)

## 2014-07-23 ENCOUNTER — Telehealth: Payer: Self-pay | Admitting: Cardiovascular Disease

## 2014-07-23 NOTE — Telephone Encounter (Signed)
Spoke with pt, she voiced understanding of medication change for the next two days.

## 2014-07-23 NOTE — Telephone Encounter (Signed)
Spoke with pt, she reports her weight is up 6 lbs this week. She had edema in her ankles this morning which is not usual. Usually the edema is gone in the morning and there at the end of the day. She is SOB with little exertion. She sleeps with the head of the bed elevated always. She is currently taking lasix 160 mg BID  She has an appointment with dr Claiborne Billings next week. Will discuss with brian hager pa

## 2014-07-23 NOTE — Telephone Encounter (Signed)
Donna Sweeney Reported a weight gain and some symptoms of Shortness of breath and not feeling well . Faxing over paper work . Please call patient

## 2014-07-23 NOTE — Telephone Encounter (Signed)
Discussed with brian hager pa, pt can take an extra 40 mg of furosemide mid-day x 2 days and then back to regular dose. Left message for pt to call

## 2014-07-28 ENCOUNTER — Encounter: Payer: Self-pay | Admitting: Cardiovascular Disease

## 2014-07-28 ENCOUNTER — Ambulatory Visit (INDEPENDENT_AMBULATORY_CARE_PROVIDER_SITE_OTHER): Payer: Medicare Other | Admitting: Cardiovascular Disease

## 2014-07-28 VITALS — BP 150/72 | HR 65 | Ht 68.0 in | Wt 273.3 lb

## 2014-07-28 DIAGNOSIS — I2510 Atherosclerotic heart disease of native coronary artery without angina pectoris: Secondary | ICD-10-CM

## 2014-07-28 DIAGNOSIS — E118 Type 2 diabetes mellitus with unspecified complications: Secondary | ICD-10-CM

## 2014-07-28 DIAGNOSIS — E669 Obesity, unspecified: Secondary | ICD-10-CM

## 2014-07-28 DIAGNOSIS — G4733 Obstructive sleep apnea (adult) (pediatric): Secondary | ICD-10-CM

## 2014-07-28 DIAGNOSIS — I10 Essential (primary) hypertension: Secondary | ICD-10-CM

## 2014-07-28 DIAGNOSIS — N184 Chronic kidney disease, stage 4 (severe): Secondary | ICD-10-CM

## 2014-07-28 DIAGNOSIS — N189 Chronic kidney disease, unspecified: Secondary | ICD-10-CM

## 2014-07-28 DIAGNOSIS — E039 Hypothyroidism, unspecified: Secondary | ICD-10-CM

## 2014-07-28 DIAGNOSIS — I2583 Coronary atherosclerosis due to lipid rich plaque: Principal | ICD-10-CM

## 2014-07-28 DIAGNOSIS — E785 Hyperlipidemia, unspecified: Secondary | ICD-10-CM

## 2014-07-28 NOTE — Progress Notes (Signed)
Patient ID: MATHEW STORCK, female   DOB: Nov 08, 1945, 68 y.o.   MRN: [id number]       HPI: FAIGA STONES is a 68 y.o. African American female who presents for a 2 month cardiology evaluation.  [name] has a history of obesity, long-standing history of hypertension, congestive heart failure and while she lived in [place] was enrolled in a heart failure clinic. She has a > 30 year history of diabetes mellitus,  a history of ventral hernia, as well as severe obstructive sleep apnea for which he utilizes CPAP therapy. An echo Doppler study in February 2014 showed moderate LVH with an ejection fraction at 60-65%,  grade 1 diastolic dysfunction and his abnormal tissue Doppler E/e' ratio greater than 20 suggesting markedly elevated LV filling pressures. She did have severe LA dilatation by volume assessment and  very mild pulmonary hypertension.  She has renal insufficiency and this has progressed from grade 3 to grade 4. Her GFR had reduced down to a nadir of 16 and is now approximately 22. She continues to have some difficulty with hypertension and Dr. Moshe Cipro had recently increased her Diovan from 160-320 mg daily. The patient tells me when she goes to pain management at times her blood pressure has ranged from 201-007 systolically. She does note shortness of breath with walking.  Since I last saw her, she underwent left arm first stage basilic vein transposition on 12/05/2013 and in June she underwent completion of her AV fistula.  She has not yet instituted dialysis.  She tells me her GFR is now 22.  Ms. Isaiah Blakes does use her CPAP 100% of the time with reference to her severe obstructive sleep apnea.  When I last saw her 67 months ago she was experiencing episodes of chest tightness, radiating to her jaw and also some mild shortness of breath and was continuing  to have difficulty with her leg edema.  She underwent a nuclear perfusion study on 06/25/2014 which was interpreted as low  risk demonstrating probable anterior soft tissue attenuation defect without ischemia.   She has stage IV chronic kidney disease and her most recent blood work from 2 weeks ago showed a BUN of 55 and a creatinine of 2.32 which is slightly improved from one month ago when it was 62 and 2.39, respectively.  Calculated GFR is  24 mL's per minute.  Past Medical History  Diagnosis Date  . Hypertension   . CHF (congestive heart failure)   . Constipation   . Hypothyroidism   . GERD (gastroesophageal reflux disease)   . Hyperlipidemia   . Hx of seasonal allergies   . Fibromyalgia   . Arthritis   . Angina at rest   . Asthma   . Diabetes mellitus   . History of cardiac cath [phone]    non obstructive CAD (done in [place]) EF 55%  . Herniated cervical disc   . Sciatica   . Diverticulitis   . Ulcerative colitis   . CAD (coronary artery disease)     non obstructive disease  . Biventricular heart failure   . Echocardiogram abnormal 10/17/12    EF 98-26%, grade I diastolic dysfunction, trivial MR, severely dilated LA  . Myocardial infarction   . Peripheral vascular disease   . Anemia   . Chronic kidney disease     stage 4 chronic kidney disease, lasr saw dr Debroah Baller  . Achilles tendon rupture 07-01-2013    right, wears boot to knee  . Bruises easily  both legs  . OSA (obstructive sleep apnea)     cpap setting of 9  . DVT (deep venous thrombosis)   . Heart murmur   . H/O hiatal hernia     Past Surgical History  Procedure Laterality Date  . Tonsillectomy    . Dilation and curettage of uterus    . Cholecystectomy    . Oophorectomy    . Cystectomy    . Knee arthroscopy    . Replacement total knee      right  . Foot surgery      right  . Bunionectomy      right  . Hammer toe surgery      right  . Mole removal      right 5th toe  . Foot surgery      joint removed from great toe and pin inserted  . Cardiac catheterization  2013, 2005, 1999    non obstructive CAD    . Abdominal hysterectomy    . Revision total knee arthroplasty Right 2009    had hematoma afterwards  . Esophagogastroduodenoscopy (egd) with propofol N/A 09/04/2013    Procedure: ESOPHAGOGASTRODUODENOSCOPY (EGD) WITH PROPOFOL;  Surgeon: Lear Ng, MD;  Location: [facility];  Service: Endoscopy;  Laterality: N/A;  . Colonoscopy with propofol N/A 09/04/2013    Procedure: COLONOSCOPY WITH PROPOFOL;  Surgeon: Lear Ng, MD;  Location: [facility];  Service: Endoscopy;  Laterality: N/A;  . Bascilic vein transposition Left 12/05/2013    Procedure: First Stage Bascilic Vein Transpostion;  Surgeon: Serafina Mitchell, MD;  Location: [facility];  Service: Vascular;  Laterality: Left;  . Bascilic vein transposition Left 01/30/2014    DR Moss Mc  . Bascilic vein transposition Left 01/30/2014    Procedure: SECOND STAGE [place];  Surgeon: Serafina Mitchell, MD;  Location: [facility];  Service: Vascular;  Laterality: Left;    Allergies  Allergen Reactions  . Ivp Dye [Iodinated Diagnostic Agents] Shortness Of Breath, Itching and Swelling    Only reaction with IV Dye.  Pt. states iodine on the skin has never caused reaction.   Marland Kitchen Raspberry Shortness Of Breath, Itching and Swelling    Current Outpatient Prescriptions  Medication Sig Dispense Refill  . albuterol (PROVENTIL HFA;VENTOLIN HFA) 108 (90 BASE) MCG/ACT inhaler Inhale 2 puffs into the lungs 2 (two) times daily as needed for shortness of breath. For shortness of breath    . aspirin 81 MG chewable tablet Chew 81 mg by mouth every morning.     . calcium-vitamin D (OSCAL WITH D) 500-200 MG-UNIT per tablet Take 1 tablet by mouth 2 (two) times daily.    . carvedilol (COREG) 25 MG tablet Take 1.5-2 tablets (37.5-50 mg total) by mouth 2 (two) times daily with a meal. Take 2 tablets in the morning and 1.5 tablets in the evening 120 tablet 2  . cholecalciferol (VITAMIN D) 1000 UNITS tablet Take 1,000 Units by mouth daily.    Marland Kitchen  COLCRYS 0.6 MG tablet Take 1 tablet by mouth 2 (two) times daily.    . DULoxetine (CYMBALTA) 60 MG capsule Take 60 mg by mouth every evening.     Marland Kitchen EPINEPHrine 0.3 mg/0.3 mL IJ SOAJ injection Inject 0.3 mg into the muscle once as needed (severe allergic reaction).    . fish oil-omega-3 fatty acids 1000 MG capsule Take 1 g by mouth 3 (three) times daily.    . fluticasone (FLONASE) 50 MCG/ACT nasal spray Place 2 sprays into the nose  daily.    . furosemide (LASIX) 80 MG tablet Take 40-80 mg by mouth 2 (two) times daily. Take 2 tablet in the morning and half tablet at lunch time    . gabapentin (NEURONTIN) 300 MG capsule Take 300 mg by mouth 3 (three) times daily.    . hydrALAZINE (APRESOLINE) 50 MG tablet Take 50 mg by mouth 3 (three) times daily.    . isosorbide mononitrate (IMDUR) 30 MG 24 hr tablet Take 1 tablet (30 mg total) by mouth daily. 30 tablet 6  . lactulose, encephalopathy, (GENERLAC) 10 GM/15ML SOLN Take 10 g by mouth 2 (two) times daily.     Marland Kitchen levothyroxine (SYNTHROID, LEVOTHROID) 100 MCG tablet Take 100 mcg by mouth daily before breakfast.     . nitroGLYCERIN (NITROSTAT) 0.4 MG SL tablet Place 0.4 mg under the tongue every 5 (five) minutes as needed for chest pain. For chest pain    . NOVOLIN N RELION 100 UNIT/ML injection Inject 20 Units into the skin 2 (two) times daily before a meal.     . NOVOLIN R 100 UNIT/ML injection Inject 5 Units into the skin as directed.    Marland Kitchen oxyCODONE-acetaminophen (PERCOCET/ROXICET) 5-325 MG per tablet Take 1 tablet by mouth every 6 (six) hours as needed for severe pain. 7.5/325 mg    . pantoprazole (PROTONIX) 20 MG tablet Take 20 mg by mouth 2 (two) times daily.    . potassium chloride SA (K-DUR,KLOR-CON) 20 MEQ tablet     . RELION INSULIN SYRINGE 1ML/31G 31G X 5/16" 1 ML MISC     . simvastatin (ZOCOR) 40 MG tablet Take 40 mg by mouth every evening.    . SYMBICORT 80-4.5 MCG/ACT inhaler Inhale 2 puffs into the lungs 2 (two) times daily.    Marland Kitchen tiZANidine  (ZANAFLEX) 2 MG tablet Take 2 mg by mouth 3 (three) times daily with meals.    . traZODone (DESYREL) 50 MG tablet Take 50 mg by mouth at bedtime.     . valsartan (DIOVAN) 320 MG tablet Take 320 mg by mouth at bedtime.    Marland Kitchen VICTOZA 18 MG/3ML SOPN Inject 1.2 Units into the skin every morning.      No current facility-administered medications for this visit.    History   Social History  . Marital Status: Divorced    Spouse Name: N/A    Number of Children: N/A  . Years of Education: N/A   Occupational History  . Not on file.   Social History Main Topics  . Smoking status: Never Smoker   . Smokeless tobacco: Never Used  . Alcohol Use: No  . Drug Use: No  . Sexual Activity: No   Other Topics Concern  . Not on file   Social History Narrative    Family History  Problem Relation Age of Onset  . Diabetes type II Mother   . Hypertension Mother   . Hyperlipidemia Mother   . Kidney disease Mother   . Fibroids Mother   . Diabetes Mother   . Heart disease Mother   . Varicose Veins Mother   . Peripheral vascular disease Mother   . Hypertension Sister   . Hypertension Brother   . Hypertension Sister   . Hypertension Sister    ROS General: Negative; No fevers, chills, or night sweats;  HEENT: Negative; No changes in vision or hearing, sinus congestion, difficulty swallowing Pulmonary: Negative; No cough, wheezing, shortness of breath, hemoptysis Cardiovascular: See history of present illness Positive for leg swelling  GI: Negative; No nausea, vomiting, diarrhea, or abdominal pain GU: Negative; No dysuria, hematuria, or difficulty voiding Musculoskeletal: History of ruptured Achilles tendon ;no myalgias, joint pain, or weakness Hematologic/Oncology: Negative; no easy bruising, bleeding Endocrine: Positive for diabetes mellitus, and hypothyroidism Neuro: Negative; no changes in balance, headaches Skin: Negative; No rashes or skin lesions Psychiatric: Negative; No behavioral  problems, depression Sleep: Positive for obstructive sleep apnea on CPAP with 100% compliance No snoring, daytime sleepiness, hypersomnolence, bruxism, restless legs, hypnogognic hallucinations, no cataplexy Other comprehensive 14 point system review is negative.    PE BP 150/72 mmHg  Pulse 65  Ht 5\' 8"  (1.727 m)  Wt 273 lb 4.8 oz (123.968 kg)  BMI 41.56 kg/m2  Repeat blood pressure 124/70 General: Alert, oriented, no distress.  Skin: normal turgor, no rashes HEENT: Normocephalic, atraumatic. Pupils round and reactive; sclera anicteric;no lid lag.  Nose without nasal septal hypertrophy Mouth/Parynx benign; Mallinpatti scale 3/4 Neck: No JVD, no carotid bruits with normal carotid upstrokes. Chest wall: No tenderness to palpation to Lungs: clear to ausculatation and percussion; no wheezing or rales Heart: RRR, s1 s2 normal 1/6 systolic murmur; no S3 gallop. No rub. Abdomen: Moderate central adiposity with question diastases recti versus a hernia.soft, nontender; no hepatosplenomehaly, BS+; abdominal aorta nontender and not dilated by palpation. Pulses 2+ Extremities:  Left arm AV fistula; 1+edema ; no clubbing cyanosis, Homan's sign negative  Bilateral ankle braces.  Neurologic: grossly nonfocal; cranial nerves grossly normal. Psychological: Normal affect and mood  ECG (independently read by me): Normal sinus rhythm with first-degree block.  LVH by voltage criteria.  Nonspecific ST changes.  October 2015 ECG (independently read by me): Normal sinus rhythm at 67 beats per minute.  LVH by voltage criteria. Mild first-degree AV block with a PR interval at 210 ms.  QTc interval 450 ms.  January 2015 ECG (independently read by me): sinus rhythm with premature atrial complexes. LVH by voltage criteria.  Prior ECG 05/28/2013: Sinus rhythm at 77 beats per minute with sinus arrhythmia and occasional PVCs.  LABS:  BMET    Component Value Date/Time   NA 143 07/08/2014 0931      Hepatic Function Panel     Component Value Date/Time   PROT 6.7 01/19/2014 0152     CBC    Component Value Date/Time   WBC 12.2* 01/29/2014 1205     BNP    Component Value Date/Time   PROBNP 1524.0* 12/24/2013 0055    Lipid Panel  No results found for: CHOL   RADIOLOGY: No results found.    ASSESSMENT AND PLAN: Ms. Cheyrl Buley is a 68 year old African American female with a history of morbid obesity with a BMI of 41.56, hypertension with documented normal systolic function with grade 1 diastolic dysfunction with elevated filling pressures by tissue Doppler.  She does have significant renal insufficiency which has progressed to class IV and is now status post AV fistula development in her left forearm, which is maturing but is not yet in use.  Her blood pressure was improved when rechecked by me at 124/70.  She continues to have mild lower extremity edema which has improved.  She now is on furosemide 80 mg twice a day, hydralazine 50 mg 3 times a day carvedilol 50 mg in the morning and 37.5 mg in the evening, as well as valsartan 320 mg daily for blood pressure control.  She is on ARB therapy in an attempt to delay the progression to end-stage renal disease and is followed  by Dr. Moshe Cipro.  At present, she has stage IV chronic kidney disease and over the last several months her creatinine has been fairly stable and GFR is 24 mL/ minute.  I reviewed her recent nuclear perfusion study with her in detail.  This demonstrates attenuation artifact without definitive scar or ischemia.  At present she does not have overt CHF , but has diastolic dysfunction which can contribute to diastolic heart failure.  She's not having significant GERD symptoms on Protonix 20 mg, which she is taking 2 times per day.  She continues to uses her CPAP therapy for her severe obstructive sleep apnea with compliance.  I will see her in 6 months for cardiology reevaluation or sooner if problems  arise.  Time spent: 25 minutes   Troy Sine, MD, [facility]  07/28/2014 6:46 PM

## 2014-07-28 NOTE — Patient Instructions (Signed)
Your physician wants you to follow-up in: 6 months or sooner if needed with Dr. Kelly. No changes were made today in your therapy. You will receive a reminder letter in the mail two months in advance. If you don't receive a letter, please call our office to schedule the follow-up appointment. 

## 2014-07-31 ENCOUNTER — Encounter (HOSPITAL_COMMUNITY): Payer: Self-pay | Admitting: Surgery

## 2014-08-05 ENCOUNTER — Encounter (HOSPITAL_COMMUNITY)
Admission: RE | Admit: 2014-08-05 | Discharge: 2014-08-05 | Disposition: A | Discharge status: Home / Self Care | Payer: Medicare Other | Source: Ambulatory Visit | Attending: Nephrology | Admitting: Nephrology

## 2014-08-05 DIAGNOSIS — D631 Anemia in chronic kidney disease: Secondary | ICD-10-CM | POA: Insufficient documentation

## 2014-08-05 DIAGNOSIS — N184 Chronic kidney disease, stage 4 (severe): Secondary | ICD-10-CM | POA: Insufficient documentation

## 2014-08-05 LAB — RENAL FUNCTION PANEL
ALBUMIN: 3.3 g/dL — AB (ref 3.5–5.2)
Anion gap: 12 (ref 5–15)
BUN: 63 mg/dL — ABNORMAL HIGH (ref 6–23)
CALCIUM: 10 mg/dL (ref 8.4–10.5)
CO2: 29 mEq/L (ref 19–32)
Chloride: 96 mEq/L (ref 96–112)
Creatinine, Ser: 2.72 mg/dL — ABNORMAL HIGH (ref 0.50–1.10)
GFR, EST AFRICAN AMERICAN: 20 mL/min — AB (ref >90)
GFR, EST NON AFRICAN AMERICAN: 17 mL/min — AB (ref >90)
Glucose, Bld: 137 mg/dL — ABNORMAL HIGH (ref 70–99)
PHOSPHORUS: 4.9 mg/dL — AB (ref 2.3–4.6)
Potassium: 3.6 mEq/L — ABNORMAL LOW (ref 3.7–5.3)
SODIUM: 137 meq/L (ref 137–147)

## 2014-08-05 LAB — IRON AND TIBC
Iron: 66 ug/dL (ref 42–135)
Saturation Ratios: 21 % (ref 20–55)
TIBC: 308 ug/dL (ref 250–470)
UIBC: 242 ug/dL (ref 125–400)

## 2014-08-05 LAB — FERRITIN: Ferritin: 616 ng/mL — ABNORMAL HIGH (ref 10–291)

## 2014-08-05 LAB — POCT HEMOGLOBIN-HEMACUE: HEMOGLOBIN: 10.1 g/dL — AB (ref 12.0–15.0)

## 2014-08-05 MED ORDER — EPOETIN ALFA 20000 UNIT/ML IJ SOLN
20000.0000 [IU] | INTRAMUSCULAR | Status: DC
  Administered 2014-08-05: 20000 [IU] via SUBCUTANEOUS

## 2014-08-05 MED ORDER — EPOETIN ALFA 20000 UNIT/ML IJ SOLN
INTRAMUSCULAR | Status: AC
  Filled 2014-08-05: qty 1

## 2014-08-06 LAB — PTH, INTACT AND CALCIUM
CALCIUM TOTAL (PTH): 9.3 mg/dL (ref 8.4–10.5)
PTH: 147 pg/mL — AB (ref 14–64)

## 2014-09-02 ENCOUNTER — Encounter (HOSPITAL_COMMUNITY)
Admission: RE | Admit: 2014-09-02 | Discharge: 2014-09-02 | Disposition: A | Discharge status: Home / Self Care | Payer: Medicare Other | Source: Ambulatory Visit | Attending: Nephrology | Admitting: Nephrology

## 2014-09-02 DIAGNOSIS — D631 Anemia in chronic kidney disease: Secondary | ICD-10-CM | POA: Diagnosis present

## 2014-09-02 DIAGNOSIS — N184 Chronic kidney disease, stage 4 (severe): Secondary | ICD-10-CM | POA: Diagnosis not present

## 2014-09-02 LAB — RENAL FUNCTION PANEL
ALBUMIN: 3.4 g/dL — AB (ref 3.5–5.2)
Anion gap: 10 (ref 5–15)
BUN: 50 mg/dL — ABNORMAL HIGH (ref 6–23)
CHLORIDE: 94 meq/L — AB (ref 96–112)
CO2: 29 mmol/L (ref 19–32)
Calcium: 9.6 mg/dL (ref 8.4–10.5)
Creatinine, Ser: 2.73 mg/dL — ABNORMAL HIGH (ref 0.50–1.10)
GFR calc Af Amer: 20 mL/min — ABNORMAL LOW (ref >90)
GFR calc non Af Amer: 17 mL/min — ABNORMAL LOW (ref >90)
Glucose, Bld: 175 mg/dL — ABNORMAL HIGH (ref 70–99)
PHOSPHORUS: 4.4 mg/dL (ref 2.3–4.6)
POTASSIUM: 3.1 mmol/L — AB (ref 3.5–5.1)
Sodium: 133 mmol/L — ABNORMAL LOW (ref 135–145)

## 2014-09-02 LAB — POCT HEMOGLOBIN-HEMACUE: Hemoglobin: 10.2 g/dL — ABNORMAL LOW (ref 12.0–15.0)

## 2014-09-02 LAB — FERRITIN: Ferritin: 351 ng/mL — ABNORMAL HIGH (ref 10–291)

## 2014-09-02 MED ORDER — EPOETIN ALFA 20000 UNIT/ML IJ SOLN
INTRAMUSCULAR | Status: AC
  Filled 2014-09-02: qty 1

## 2014-09-02 MED ORDER — EPOETIN ALFA 20000 UNIT/ML IJ SOLN
20000.0000 [IU] | INTRAMUSCULAR | Status: DC
  Administered 2014-09-02: 20000 [IU] via SUBCUTANEOUS

## 2014-09-03 ENCOUNTER — Encounter (HOSPITAL_COMMUNITY): Payer: Self-pay | Admitting: Surgery

## 2014-09-03 LAB — IRON AND TIBC
IRON: 66 ug/dL (ref 42–145)
Saturation Ratios: 21 % (ref 20–55)
TIBC: 308 ug/dL (ref 250–470)
UIBC: 242 ug/dL (ref 125–400)

## 2014-09-03 LAB — PTH, INTACT AND CALCIUM
Calcium, Total (PTH): 9.9 mg/dL (ref 8.7–10.3)
PTH: 73 pg/mL — ABNORMAL HIGH (ref 15–65)

## 2014-09-19 IMAGING — CR DG ANKLE COMPLETE 3+V*L*
3 series · 3 of 3 positions shown · non-contrast
Comparison: None.

CLINICAL DATA: Twisting injury to the left ankle. Pain and
swelling.

EXAM:
LEFT ANKLE COMPLETE - 3+ VIEW

[x ankle ap left]
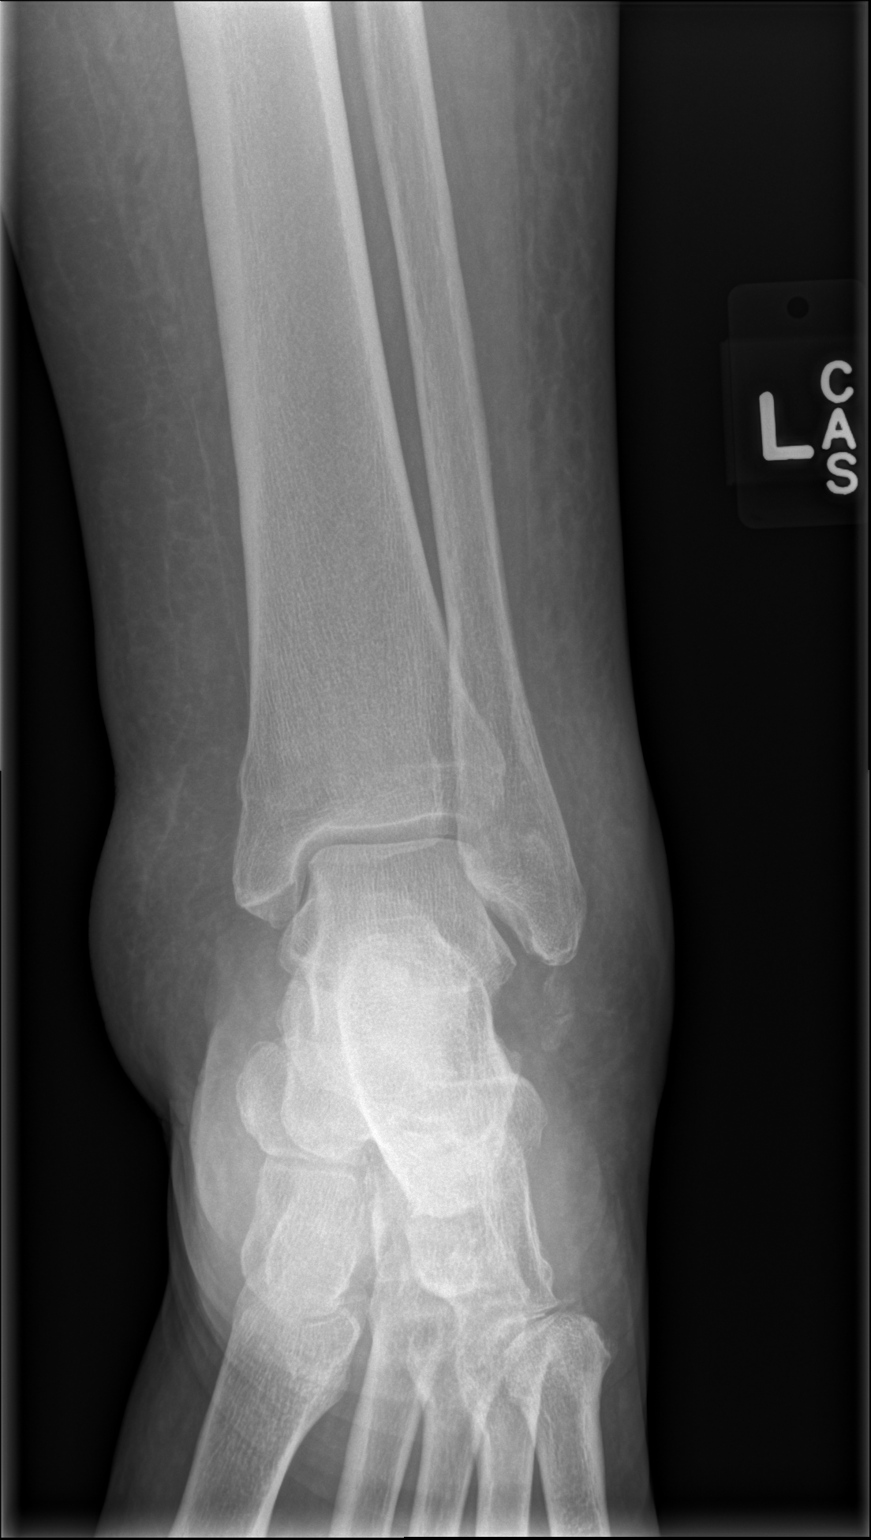

[x ankle obl left]
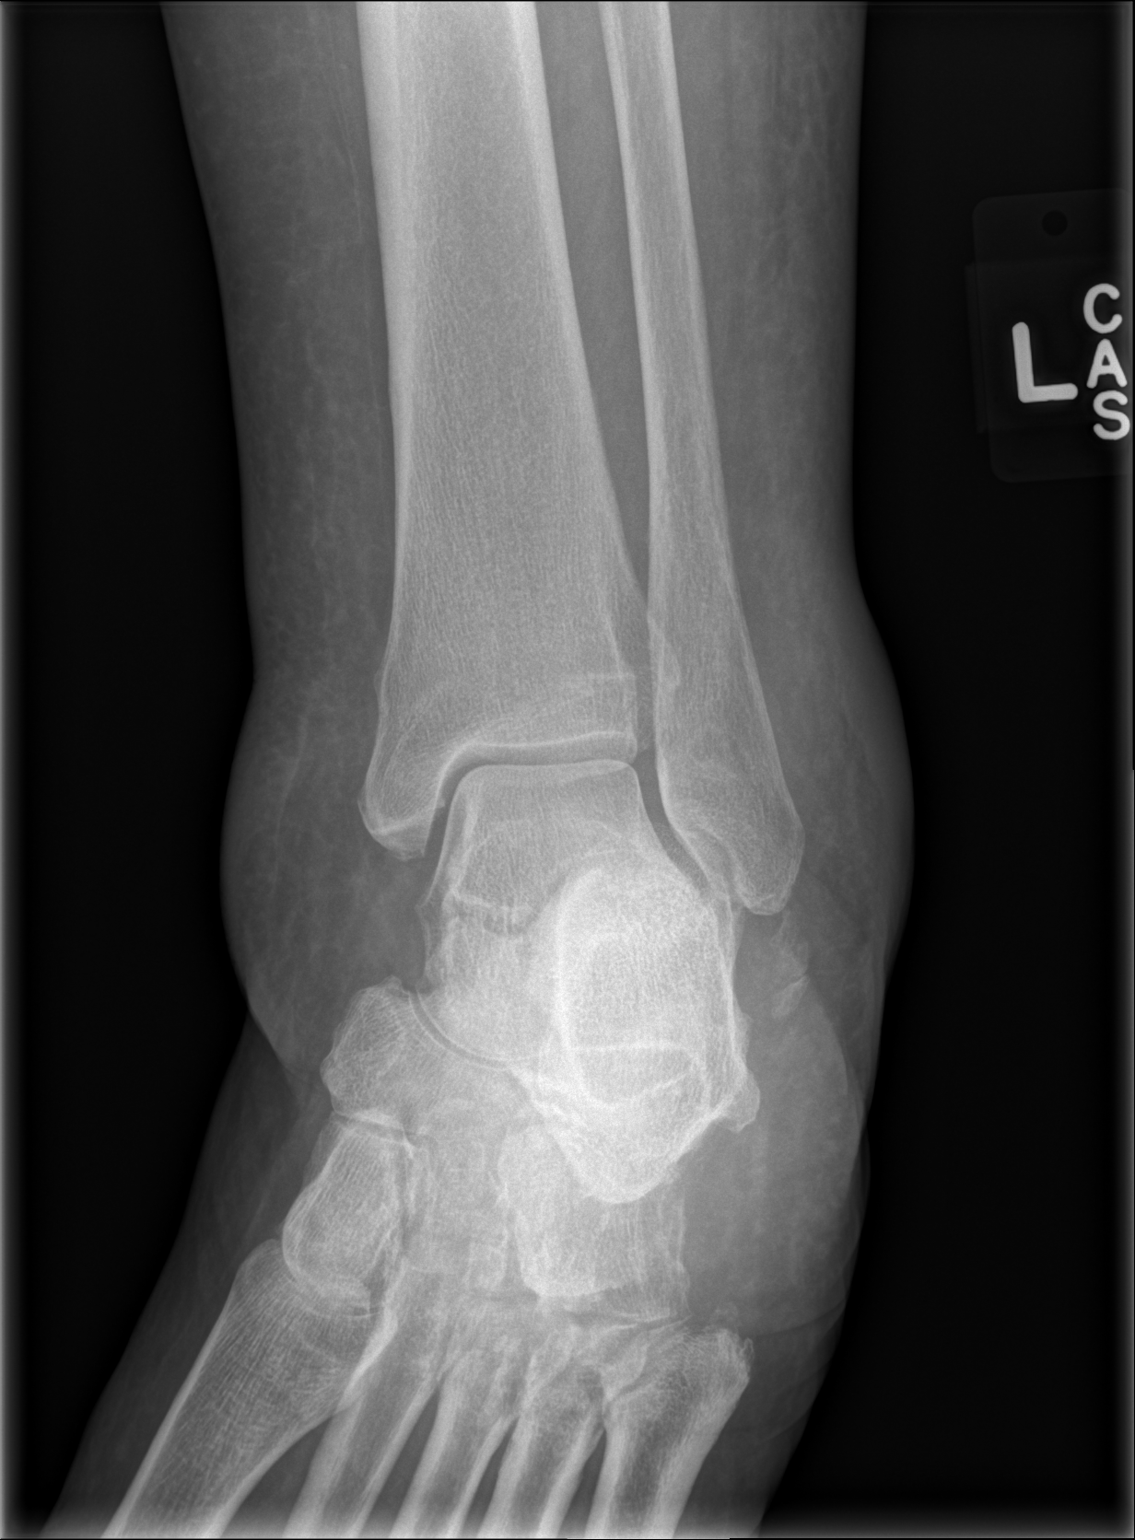

[x ankle lat left]
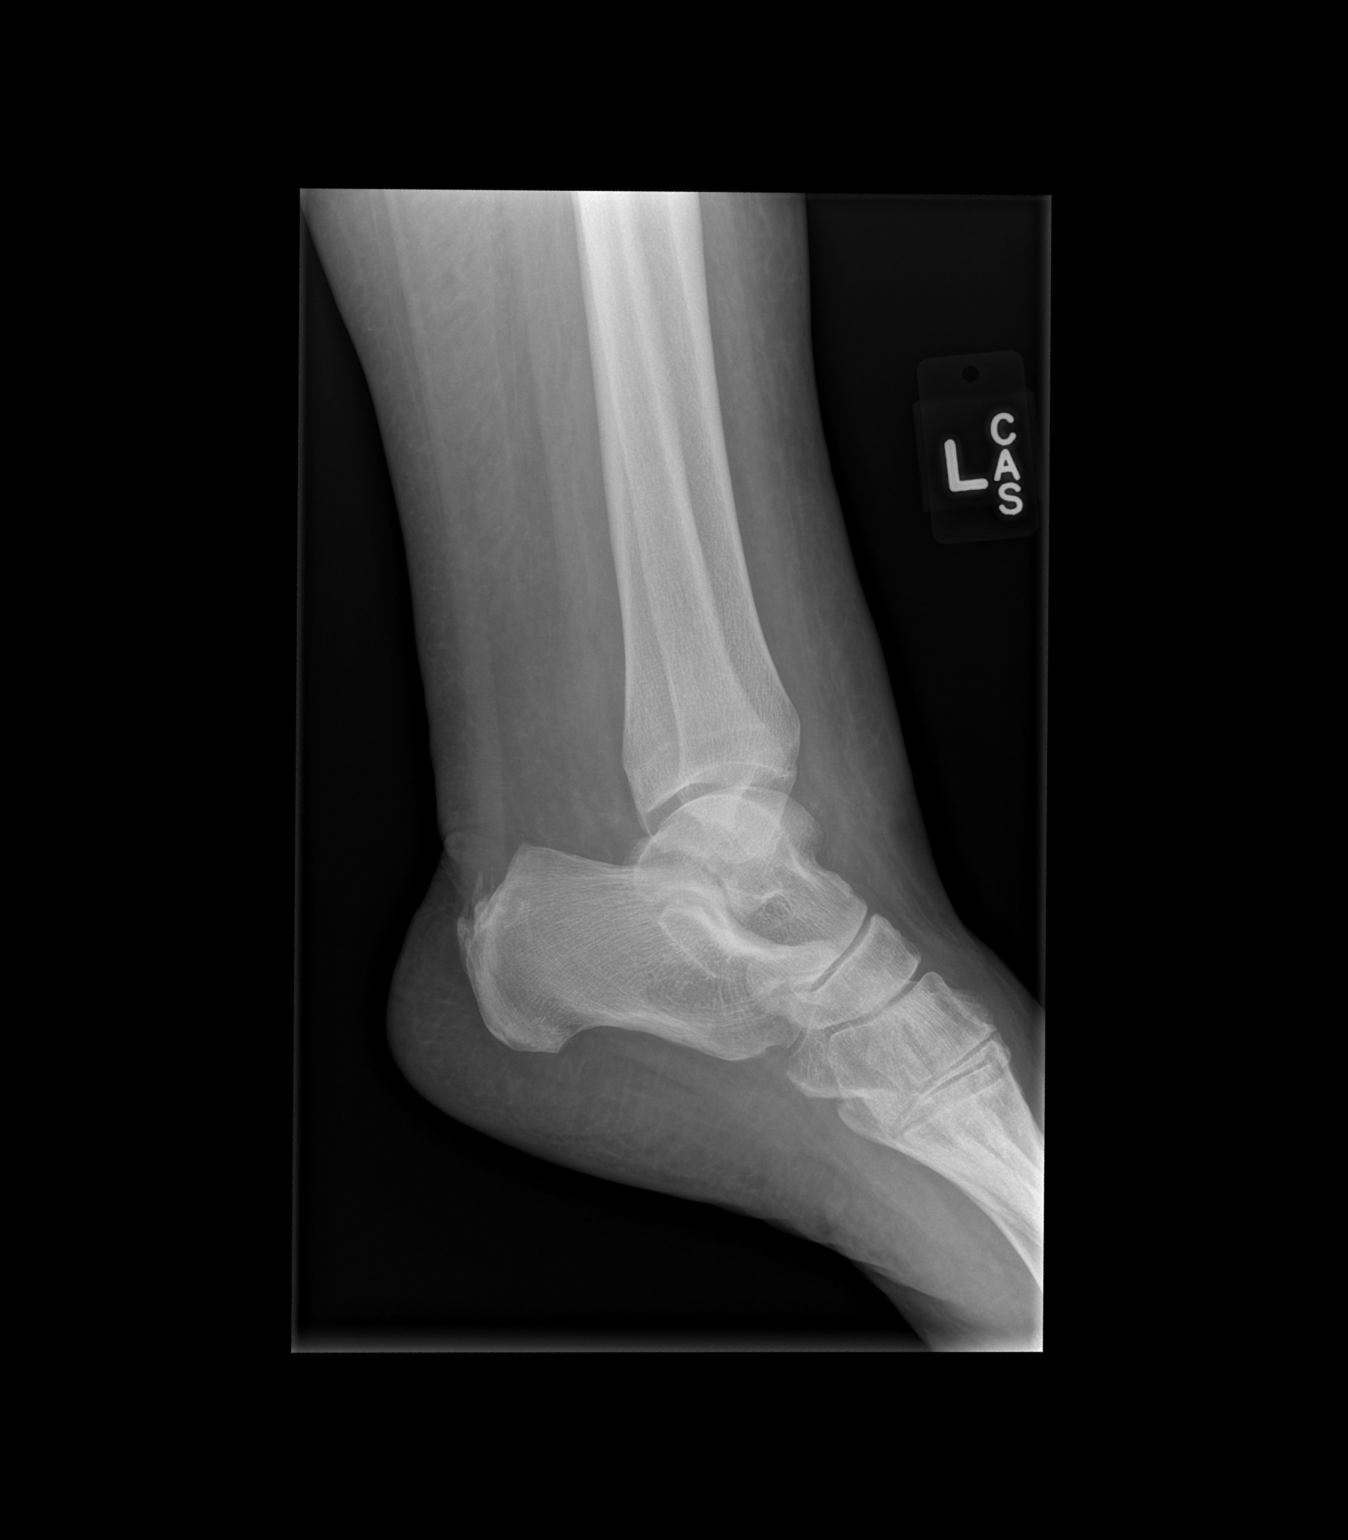

[3 of 3 positions shown; findings below may reference images not displayed]

FINDINGS: There are well corticated bone densities below the inferior margin
of the distal fibula. This may reflect chronic avulsion fractures or
accessory ossification centers. They appear to be chronic.

No evidence of acute fracture. Ankle mortise is normally space and
aligned.

Bones are demineralized.  There is diffuse soft tissue swelling.
IMPRESSION: No acute fracture.  Ankle mortise normally aligned.

## 2014-09-30 ENCOUNTER — Encounter (HOSPITAL_COMMUNITY)
Admission: RE | Admit: 2014-09-30 | Discharge: 2014-09-30 | Disposition: A | Discharge status: Home / Self Care | Payer: Medicare Other | Source: Ambulatory Visit | Attending: Nephrology | Admitting: Nephrology

## 2014-09-30 DIAGNOSIS — D631 Anemia in chronic kidney disease: Secondary | ICD-10-CM | POA: Insufficient documentation

## 2014-09-30 DIAGNOSIS — N184 Chronic kidney disease, stage 4 (severe): Secondary | ICD-10-CM | POA: Insufficient documentation

## 2014-09-30 LAB — RENAL FUNCTION PANEL
ANION GAP: 12 (ref 5–15)
Albumin: 3.3 g/dL — ABNORMAL LOW (ref 3.5–5.2)
BUN: 55 mg/dL — AB (ref 6–23)
CO2: 28 mmol/L (ref 19–32)
CREATININE: 2.95 mg/dL — AB (ref 0.50–1.10)
Calcium: 9.8 mg/dL (ref 8.4–10.5)
Chloride: 99 mmol/L (ref 96–112)
GFR calc Af Amer: 18 mL/min — ABNORMAL LOW (ref >90)
GFR calc non Af Amer: 15 mL/min — ABNORMAL LOW (ref >90)
Glucose, Bld: 179 mg/dL — ABNORMAL HIGH (ref 70–99)
PHOSPHORUS: 5 mg/dL — AB (ref 2.3–4.6)
Potassium: 3.2 mmol/L — ABNORMAL LOW (ref 3.5–5.1)
Sodium: 139 mmol/L (ref 135–145)

## 2014-09-30 LAB — IRON AND TIBC
Iron: 55 ug/dL (ref 42–145)
SATURATION RATIOS: 17 % — AB (ref 20–55)
TIBC: 321 ug/dL (ref 250–470)
UIBC: 266 ug/dL (ref 125–400)

## 2014-09-30 LAB — FERRITIN: Ferritin: 243 ng/mL (ref 10–291)

## 2014-09-30 LAB — POCT HEMOGLOBIN-HEMACUE: Hemoglobin: 12.6 g/dL (ref 12.0–15.0)

## 2014-09-30 MED ORDER — EPOETIN ALFA 20000 UNIT/ML IJ SOLN: 20000.0000 [IU] | INTRAMUSCULAR | Status: DC

## 2014-10-01 LAB — PTH, INTACT AND CALCIUM
Calcium, Total (PTH): 10 mg/dL (ref 8.7–10.3)
PTH: 79 pg/mL — AB (ref 15–65)

## 2014-10-05 ENCOUNTER — Other Ambulatory Visit: Payer: Self-pay | Admitting: *Deleted

## 2014-10-05 DIAGNOSIS — I83813 Varicose veins of bilateral lower extremities with pain: Secondary | ICD-10-CM

## 2014-10-14 ENCOUNTER — Encounter (HOSPITAL_COMMUNITY)
Admission: RE | Admit: 2014-10-14 | Discharge: 2014-10-14 | Disposition: A | Discharge status: Home / Self Care | Payer: Medicare Other | Source: Ambulatory Visit | Attending: Nephrology | Admitting: Nephrology

## 2014-10-14 DIAGNOSIS — D631 Anemia in chronic kidney disease: Secondary | ICD-10-CM | POA: Diagnosis not present

## 2014-10-14 LAB — POCT HEMOGLOBIN-HEMACUE: Hemoglobin: 10.5 g/dL — ABNORMAL LOW (ref 12.0–15.0)

## 2014-10-14 MED ORDER — EPOETIN ALFA 20000 UNIT/ML IJ SOLN
INTRAMUSCULAR | Status: AC
  Administered 2014-10-14: 20000 [IU] via SUBCUTANEOUS
  Filled 2014-10-14: qty 1

## 2014-10-14 MED ORDER — EPOETIN ALFA 20000 UNIT/ML IJ SOLN: 20000.0000 [IU] | INTRAMUSCULAR | Status: DC

## 2014-11-09 ENCOUNTER — Encounter: Payer: Self-pay | Admitting: Vascular Surgery

## 2014-11-10 ENCOUNTER — Ambulatory Visit (INDEPENDENT_AMBULATORY_CARE_PROVIDER_SITE_OTHER): Payer: Medicare Other | Admitting: Vascular Surgery

## 2014-11-10 ENCOUNTER — Ambulatory Visit (HOSPITAL_COMMUNITY)
Admission: RE | Admit: 2014-11-10 | Discharge: 2014-11-10 | Disposition: A | Discharge status: Home / Self Care | Payer: Medicare Other | Source: Ambulatory Visit | Attending: Vascular Surgery | Admitting: Vascular Surgery

## 2014-11-10 VITALS — BP 138/73 | HR 61 | Resp 16 | Ht 68.0 in | Wt 262.0 lb

## 2014-11-10 DIAGNOSIS — I83813 Varicose veins of bilateral lower extremities with pain: Secondary | ICD-10-CM | POA: Diagnosis not present

## 2014-11-10 DIAGNOSIS — I83891 Varicose veins of right lower extremities with other complications: Secondary | ICD-10-CM | POA: Diagnosis not present

## 2014-11-10 NOTE — Progress Notes (Signed)
Subjective:     Patient ID: Donna Sweeney, female   DOB: 1945/12/25, 69 y.o.   MRN: [id number]  HPI this 69 year old female is evaluated for painful varicosities right leg worse than left. She has no nerve practice having had a basilic vein transposition performed by Dr. Trula Slade last year. Patient has chronic renal insufficiency but is not on hemodialysis at this point. Patient has a history of DVT which is vague but no, stasis ulcers, bleeding, or other complications. She has been on blood thinners on one occasion in the past for very short period of time. She does not were elastic compression stockings. She has tried short leg stockings on occasion. She has aching throbbing and burning discomfort in the right calf area which worsens as the day progresses.  Past Medical History  Diagnosis Date  . Hypertension   . CHF (congestive heart failure)   . Constipation   . Hypothyroidism   . GERD (gastroesophageal reflux disease)   . Hyperlipidemia   . Hx of seasonal allergies   . Fibromyalgia   . Arthritis   . Angina at rest   . Asthma   . Diabetes mellitus   . History of cardiac cath [phone]    non obstructive CAD (done in [place]) EF 55%  . Herniated cervical disc   . Sciatica   . Diverticulitis   . Ulcerative colitis   . CAD (coronary artery disease)     non obstructive disease  . Biventricular heart failure   . Echocardiogram abnormal 10/17/12    EF 40-34%, grade I diastolic dysfunction, trivial MR, severely dilated LA  . Myocardial infarction   . Peripheral vascular disease   . Anemia   . Chronic kidney disease     stage 4 chronic kidney disease, lasr saw dr Debroah Baller  . Achilles tendon rupture 07-01-2013    right, wears boot to knee  . Bruises easily     both legs  . OSA (obstructive sleep apnea)     cpap setting of 9  . DVT (deep venous thrombosis)   . Heart murmur   . H/O hiatal hernia     History  Substance Use Topics  . Smoking status: Never Smoker   .  Smokeless tobacco: Never Used  . Alcohol Use: No    Family History  Problem Relation Age of Onset  . Diabetes type II Mother   . Hypertension Mother   . Hyperlipidemia Mother   . Kidney disease Mother   . Fibroids Mother   . Diabetes Mother   . Heart disease Mother   . Varicose Veins Mother   . Peripheral vascular disease Mother   . Hypertension Sister   . Hypertension Brother   . Hypertension Sister   . Hypertension Sister     Allergies  Allergen Reactions  . Ivp Dye [Iodinated Diagnostic Agents] Shortness Of Breath, Itching and Swelling    Only reaction with IV Dye.  Pt. states iodine on the skin has never caused reaction.   Marland Kitchen Raspberry Shortness Of Breath, Itching and Swelling     Current outpatient prescriptions:  .  albuterol (PROVENTIL HFA;VENTOLIN HFA) 108 (90 BASE) MCG/ACT inhaler, Inhale 2 puffs into the lungs 2 (two) times daily as needed for shortness of breath. For shortness of breath, Disp: , Rfl:  .  aspirin 81 MG chewable tablet, Chew 81 mg by mouth every morning. , Disp: , Rfl:  .  calcium-vitamin D (OSCAL WITH D) 500-200 MG-UNIT per tablet, Take 1 tablet  by mouth 2 (two) times daily., Disp: , Rfl:  .  carvedilol (COREG) 25 MG tablet, Take 1.5-2 tablets (37.5-50 mg total) by mouth 2 (two) times daily with a meal. Take 2 tablets in the morning and 1.5 tablets in the evening, Disp: 120 tablet, Rfl: 2 .  cholecalciferol (VITAMIN D) 1000 UNITS tablet, Take 1,000 Units by mouth daily., Disp: , Rfl:  .  COLCRYS 0.6 MG tablet, Take 1 tablet by mouth 2 (two) times daily., Disp: , Rfl:  .  DULoxetine (CYMBALTA) 60 MG capsule, Take 60 mg by mouth every evening. , Disp: , Rfl:  .  EPINEPHrine 0.3 mg/0.3 mL IJ SOAJ injection, Inject 0.3 mg into the muscle once as needed (severe allergic reaction)., Disp: , Rfl:  .  fish oil-omega-3 fatty acids 1000 MG capsule, Take 1 g by mouth 3 (three) times daily., Disp: , Rfl:  .  fluticasone (FLONASE) 50 MCG/ACT nasal spray, Place 2  sprays into the nose daily., Disp: , Rfl:  .  furosemide (LASIX) 80 MG tablet, Take 40-80 mg by mouth 2 (two) times daily. Take 2 tablet in the morning and half tablet at lunch time, Disp: , Rfl:  .  gabapentin (NEURONTIN) 300 MG capsule, Take 300 mg by mouth 3 (three) times daily., Disp: , Rfl:  .  hydrALAZINE (APRESOLINE) 50 MG tablet, Take 50 mg by mouth 3 (three) times daily., Disp: , Rfl:  .  isosorbide mononitrate (IMDUR) 30 MG 24 hr tablet, Take 1 tablet (30 mg total) by mouth daily., Disp: 30 tablet, Rfl: 6 .  lactulose, encephalopathy, (GENERLAC) 10 GM/15ML SOLN, Take 10 g by mouth 2 (two) times daily. , Disp: , Rfl:  .  levothyroxine (SYNTHROID, LEVOTHROID) 100 MCG tablet, Take 100 mcg by mouth daily before breakfast. , Disp: , Rfl:  .  nitroGLYCERIN (NITROSTAT) 0.4 MG SL tablet, Place 0.4 mg under the tongue every 5 (five) minutes as needed for chest pain. For chest pain, Disp: , Rfl:  .  NOVOLIN N RELION 100 UNIT/ML injection, Inject 20 Units into the skin 2 (two) times daily before a meal. , Disp: , Rfl:  .  NOVOLIN R 100 UNIT/ML injection, Inject 5 Units into the skin as directed., Disp: , Rfl:  .  oxyCODONE-acetaminophen (PERCOCET/ROXICET) 5-325 MG per tablet, Take 1 tablet by mouth every 6 (six) hours as needed for severe pain. 7.5/325 mg, Disp: , Rfl:  .  pantoprazole (PROTONIX) 20 MG tablet, Take 20 mg by mouth 2 (two) times daily., Disp: , Rfl:  .  potassium chloride SA (K-DUR,KLOR-CON) 20 MEQ tablet, , Disp: , Rfl:  .  RELION INSULIN SYRINGE 1ML/31G 31G X 5/16" 1 ML MISC, , Disp: , Rfl:  .  simvastatin (ZOCOR) 40 MG tablet, Take 40 mg by mouth every evening., Disp: , Rfl:  .  SYMBICORT 80-4.5 MCG/ACT inhaler, Inhale 2 puffs into the lungs 2 (two) times daily., Disp: , Rfl:  .  tiZANidine (ZANAFLEX) 2 MG tablet, Take 2 mg by mouth 3 (three) times daily with meals., Disp: , Rfl:  .  traZODone (DESYREL) 50 MG tablet, Take 50 mg by mouth at bedtime. , Disp: , Rfl:  .  valsartan  (DIOVAN) 320 MG tablet, Take 320 mg by mouth at bedtime., Disp: , Rfl:  .  VICTOZA 18 MG/3ML SOPN, Inject 1.2 Units into the skin every morning. , Disp: , Rfl:   Filed Vitals:   11/10/14 1102  BP: 138/73  Pulse: 61  Resp: 16  Height:  5\' 8"  (1.727 m)  Weight: 262 lb (118.842 kg)    Body mass index is 39.85 kg/(m^2).         Review of Systems denies chest pain. Does complain of pain in legs with walking and while lying flat a history of thrombophlebitis swelling in both legs and asthma.     Objective:   Physical Exam BP 138/73 mmHg  Pulse 61  Resp 16  Ht 5\' 8"  (1.727 m)  Wt 262 lb (118.842 kg)  BMI 39.85 kg/m2  Gen. obese female no apparent stress alert and oriented 3 Lungs no rhonchi or wheezing Cardiovascular regular rhythm no murmurs Right lower extremity with bulging varicosities over the great saphenous system beginning in the distal thigh extending into the medial calf with 1+ distal edema and mild hyperpigmentation-no ulceration noted. Left leg with 1+ edema but no large bulging varicosities noted.  Today I ordered bilateral venous duplex exam which I reviewed and interpreted. The right leg has gross reflux in the great saphenous vein which is a large vein supplying these bulging varicosities and there is no DVT. Left leg has gross reflux in the great saphenous vein but it is a smaller caliber vein.     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 She will then return in 3 months to see if stab phlebectomy of painful varicosities will be necessary Return in 3 months

## 2014-11-11 ENCOUNTER — Encounter (HOSPITAL_COMMUNITY)
Admission: RE | Admit: 2014-11-11 | Discharge: 2014-11-11 | Disposition: A | Discharge status: Home / Self Care | Payer: Medicare Other | Source: Ambulatory Visit | Attending: Nephrology | Admitting: Nephrology

## 2014-11-11 DIAGNOSIS — D631 Anemia in chronic kidney disease: Secondary | ICD-10-CM | POA: Diagnosis not present

## 2014-11-11 DIAGNOSIS — N184 Chronic kidney disease, stage 4 (severe): Secondary | ICD-10-CM | POA: Insufficient documentation

## 2014-11-11 LAB — RENAL FUNCTION PANEL
ALBUMIN: 3.5 g/dL (ref 3.5–5.2)
Anion gap: 10 (ref 5–15)
BUN: 48 mg/dL — ABNORMAL HIGH (ref 6–23)
CO2: 31 mmol/L (ref 19–32)
Calcium: 10.1 mg/dL (ref 8.4–10.5)
Chloride: 99 mmol/L (ref 96–112)
Creatinine, Ser: 2.89 mg/dL — ABNORMAL HIGH (ref 0.50–1.10)
GFR calc Af Amer: 18 mL/min — ABNORMAL LOW (ref >90)
GFR, EST NON AFRICAN AMERICAN: 16 mL/min — AB (ref >90)
Glucose, Bld: 92 mg/dL (ref 70–99)
POTASSIUM: 4.2 mmol/L (ref 3.5–5.1)
Phosphorus: 4.5 mg/dL (ref 2.3–4.6)
Sodium: 140 mmol/L (ref 135–145)

## 2014-11-11 LAB — IRON AND TIBC
Iron: 45 ug/dL (ref 42–145)
Saturation Ratios: 13 % — ABNORMAL LOW (ref 20–55)
TIBC: 351 ug/dL (ref 250–470)
UIBC: 306 ug/dL (ref 125–400)

## 2014-11-11 LAB — FERRITIN: FERRITIN: 156 ng/mL (ref 10–291)

## 2014-11-11 LAB — POCT HEMOGLOBIN-HEMACUE: HEMOGLOBIN: 10.3 g/dL — AB (ref 12.0–15.0)

## 2014-11-11 MED ORDER — EPOETIN ALFA 20000 UNIT/ML IJ SOLN
20000.0000 [IU] | INTRAMUSCULAR | Status: DC
  Administered 2014-11-11: 20000 [IU] via SUBCUTANEOUS

## 2014-11-11 MED ORDER — EPOETIN ALFA 20000 UNIT/ML IJ SOLN
INTRAMUSCULAR | Status: AC
  Filled 2014-11-11: qty 1

## 2014-11-12 LAB — PTH, INTACT AND CALCIUM
Calcium, Total (PTH): 10 mg/dL (ref 8.7–10.3)
PTH: 202 pg/mL — ABNORMAL HIGH (ref 15–65)

## 2014-11-24 ENCOUNTER — Other Ambulatory Visit: Payer: Self-pay

## 2014-11-24 DIAGNOSIS — Z1231 Encounter for screening mammogram for malignant neoplasm of breast: Secondary | ICD-10-CM

## 2014-11-25 ENCOUNTER — Other Ambulatory Visit: Payer: Self-pay | Admitting: *Deleted

## 2014-11-25 MED ORDER — CARVEDILOL 25 MG PO TABS: ORAL_TABLET | ORAL | Status: DC

## 2014-11-25 NOTE — Telephone Encounter (Signed)
Rx(s) sent to pharmacy electronically.  

## 2014-12-08 ENCOUNTER — Other Ambulatory Visit (HOSPITAL_COMMUNITY): Payer: Self-pay | Admitting: *Deleted

## 2014-12-09 ENCOUNTER — Encounter (HOSPITAL_COMMUNITY)
Admission: RE | Admit: 2014-12-09 | Discharge: 2014-12-09 | Disposition: A | Discharge status: Home / Self Care | Payer: Medicare Other | Source: Ambulatory Visit | Attending: Nephrology | Admitting: Nephrology

## 2014-12-09 DIAGNOSIS — N184 Chronic kidney disease, stage 4 (severe): Secondary | ICD-10-CM | POA: Insufficient documentation

## 2014-12-09 DIAGNOSIS — D631 Anemia in chronic kidney disease: Secondary | ICD-10-CM | POA: Insufficient documentation

## 2014-12-09 LAB — IRON AND TIBC
Iron: 31 ug/dL — ABNORMAL LOW (ref 42–145)
Saturation Ratios: 10 % — ABNORMAL LOW (ref 20–55)
TIBC: 315 ug/dL (ref 250–470)
UIBC: 284 ug/dL (ref 125–400)

## 2014-12-09 LAB — RENAL FUNCTION PANEL
ALBUMIN: 2.9 g/dL — AB (ref 3.5–5.2)
Anion gap: 11 (ref 5–15)
BUN: 100 mg/dL — ABNORMAL HIGH (ref 6–23)
CALCIUM: 9.3 mg/dL (ref 8.4–10.5)
CO2: 29 mmol/L (ref 19–32)
CREATININE: 3.09 mg/dL — AB (ref 0.50–1.10)
Chloride: 97 mmol/L (ref 96–112)
GFR calc Af Amer: 17 mL/min — ABNORMAL LOW (ref >90)
GFR, EST NON AFRICAN AMERICAN: 15 mL/min — AB (ref >90)
Glucose, Bld: 154 mg/dL — ABNORMAL HIGH (ref 70–99)
Phosphorus: 5.5 mg/dL — ABNORMAL HIGH (ref 2.3–4.6)
Potassium: 4.6 mmol/L (ref 3.5–5.1)
Sodium: 137 mmol/L (ref 135–145)

## 2014-12-09 LAB — FERRITIN: Ferritin: 128 ng/mL (ref 10–291)

## 2014-12-09 LAB — POCT HEMOGLOBIN-HEMACUE: Hemoglobin: 9.4 g/dL — ABNORMAL LOW (ref 12.0–15.0)

## 2014-12-09 MED ORDER — SODIUM CHLORIDE 0.9 % IV SOLN
510.0000 mg | INTRAVENOUS | Status: DC
  Administered 2014-12-09: 510 mg via INTRAVENOUS
  Filled 2014-12-09: qty 17

## 2014-12-09 MED ORDER — EPOETIN ALFA 20000 UNIT/ML IJ SOLN
INTRAMUSCULAR | Status: AC
  Filled 2014-12-09: qty 1

## 2014-12-09 MED ORDER — EPOETIN ALFA 20000 UNIT/ML IJ SOLN
20000.0000 [IU] | INTRAMUSCULAR | Status: DC
  Administered 2014-12-09: 20000 [IU] via SUBCUTANEOUS

## 2014-12-10 LAB — PTH, INTACT AND CALCIUM
CALCIUM TOTAL (PTH): 9.6 mg/dL (ref 8.7–10.3)
PTH: 161 pg/mL — ABNORMAL HIGH (ref 15–65)

## 2014-12-22 ENCOUNTER — Ambulatory Visit
Admission: RE | Admit: 2014-12-22 | Discharge: 2014-12-22 | Disposition: A | Discharge status: Home / Self Care | Payer: Medicare Other | Source: Ambulatory Visit

## 2014-12-22 DIAGNOSIS — Z1231 Encounter for screening mammogram for malignant neoplasm of breast: Secondary | ICD-10-CM

## 2014-12-27 IMAGING — CT CT PELVIS W/O CM
2 of 7 series · 13 of 46 positions shown, 20 images · non-contrast
Comparison: MRI 01/08/2013

CLINICAL DATA: Bilateral hip lipomas for 3 years with bilateral hip
pain

EXAM:
CT PELVIS WITHOUT CONTRAST
TECHNIQUE: Multidetector CT imaging of the pelvis was performed following the
standard protocol without intravenous contrast.

[Series 9: rt pelvis · axial · 0.98mm/px · z∈[-359,-34]mm · 10 of 158 slices shown, 16 images]
[im 14/158  soft-tissue]
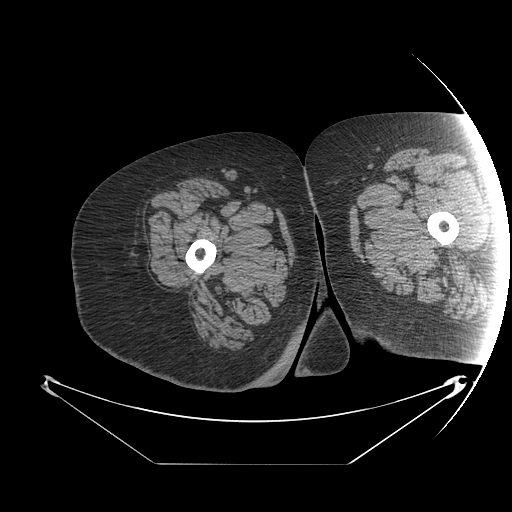
[im 14/158  bone]
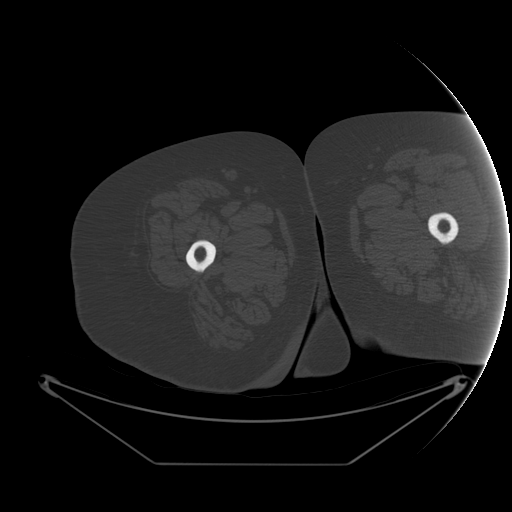
[im 27/158  soft-tissue]
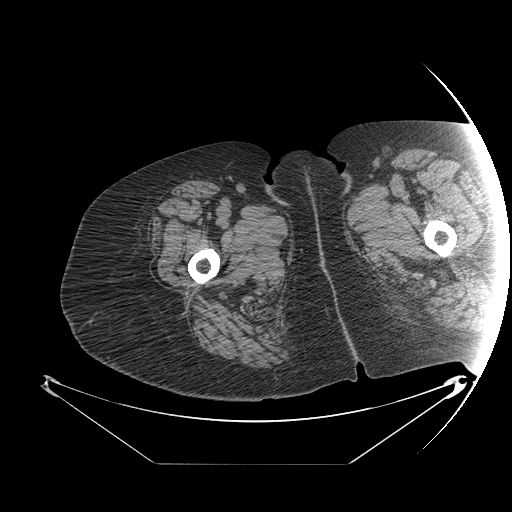
[im 40/158  soft-tissue]
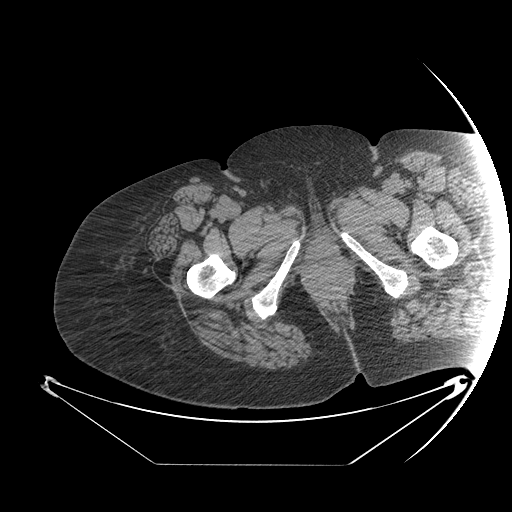
[im 53/158  soft-tissue]
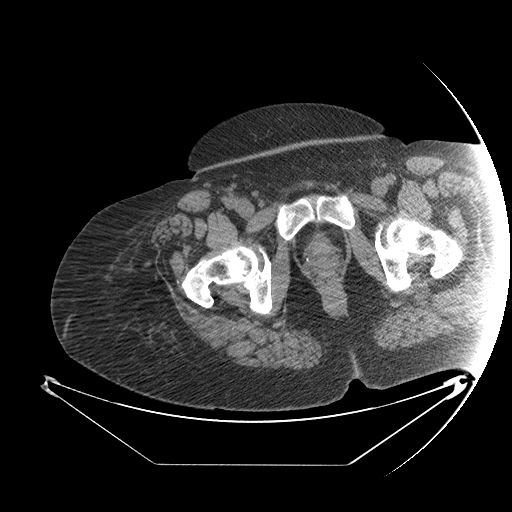
[im 66/158  soft-tissue]
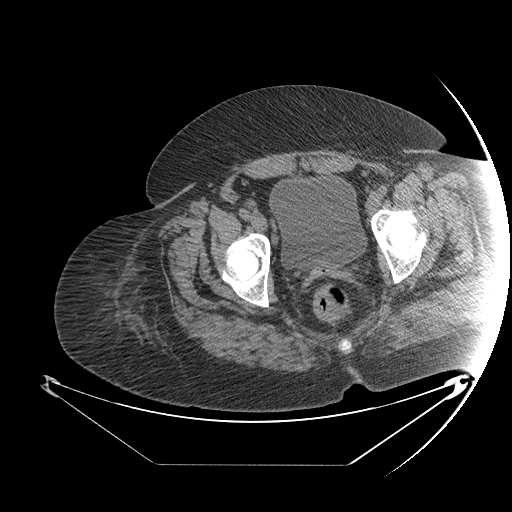
[im 92/158  soft-tissue]
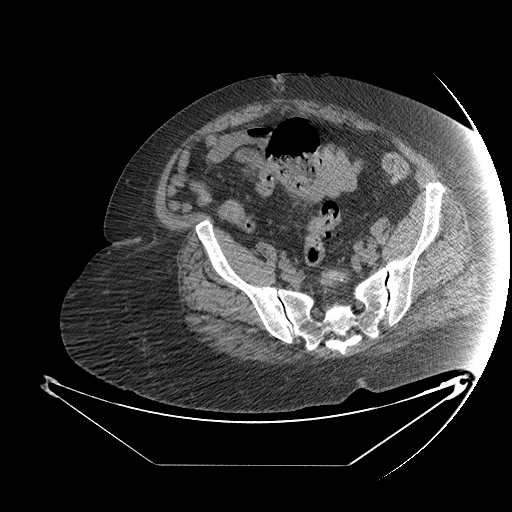
[im 105/158  soft-tissue]
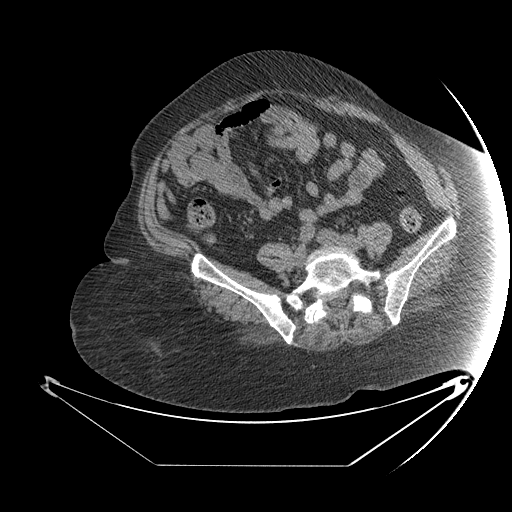
[im 105/158  lung]
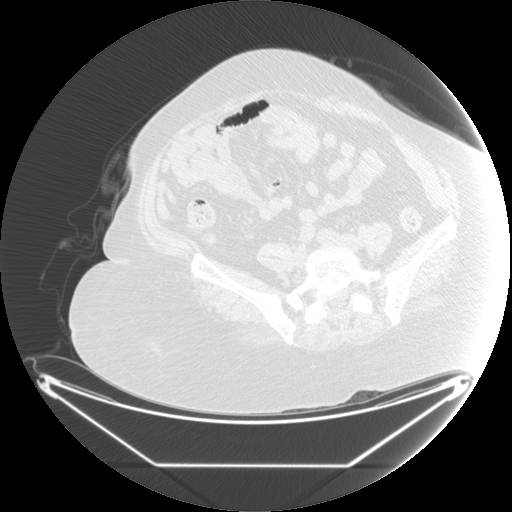
[im 118/158  soft-tissue]
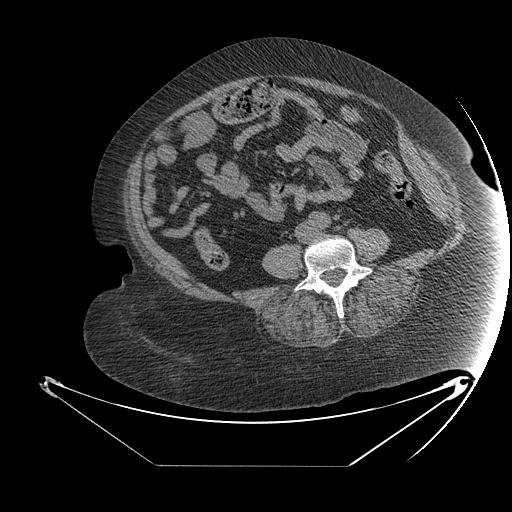
[im 118/158  lung]
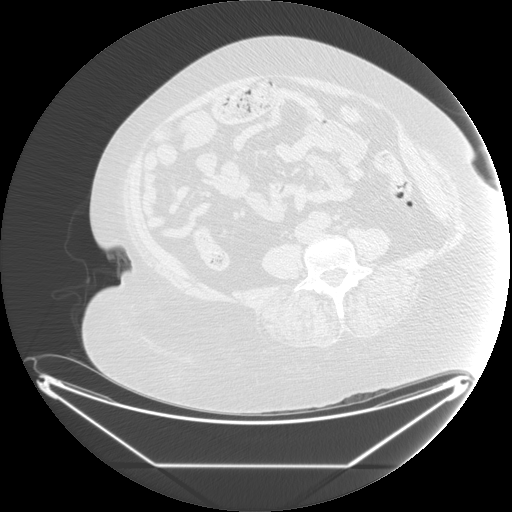
[im 131/158  soft-tissue]
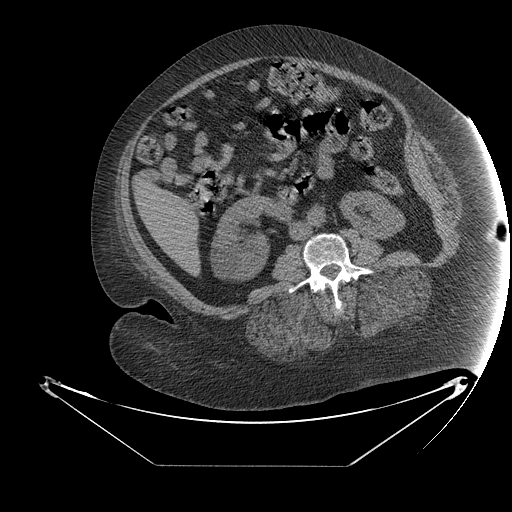
[im 131/158  lung]
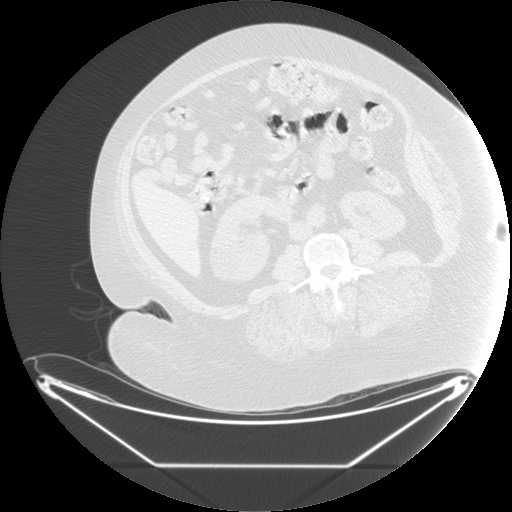
[im 131/158  bone]
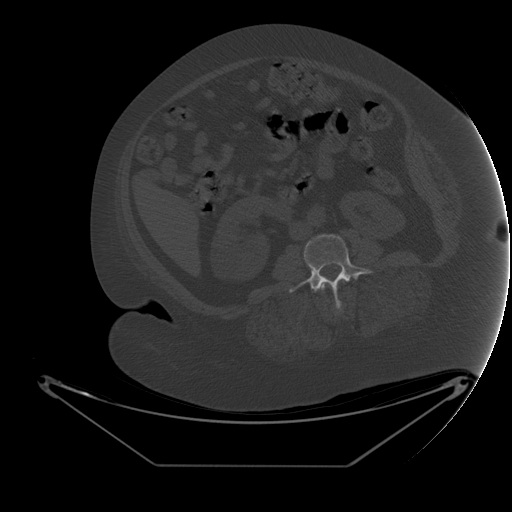
[im 144/158  soft-tissue]
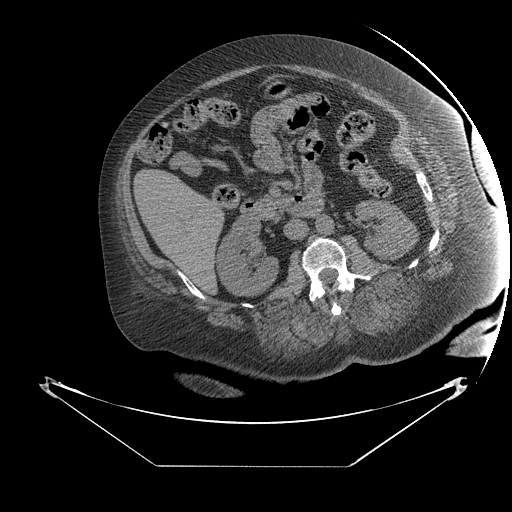
[im 144/158  lung]
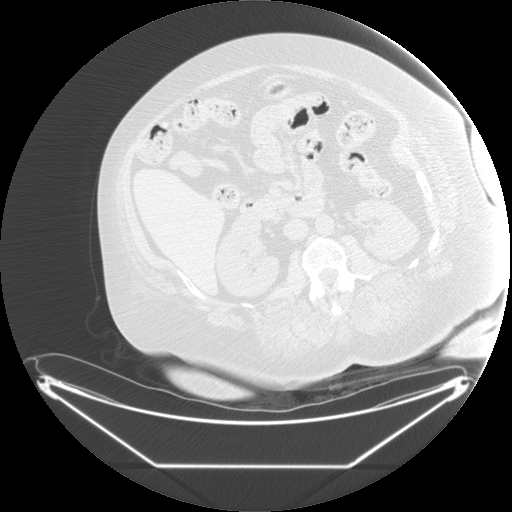

[Series 700: left cor soft · coronal · 0.98mm/px · 3 of 173 slices shown, 4 images]
[im 44/173  soft-tissue]
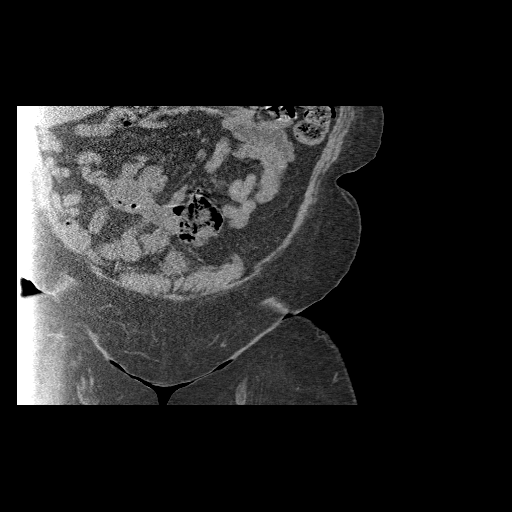
[im 87/173  soft-tissue]
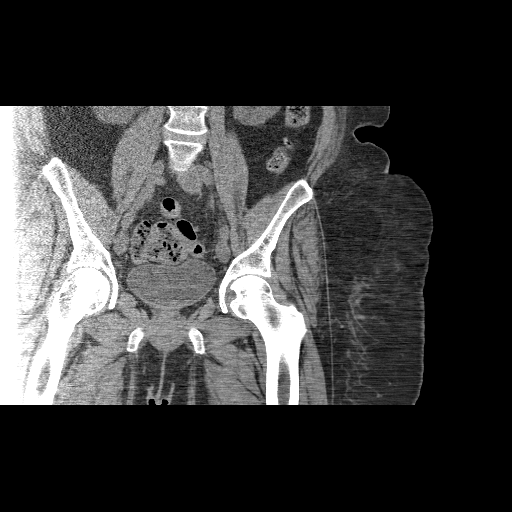
[im 87/173  bone]
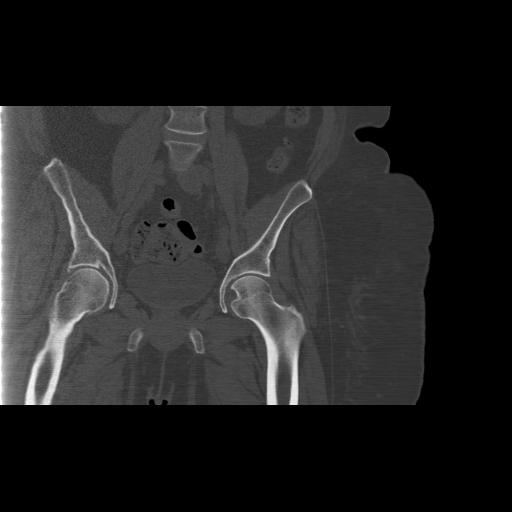
[im 130/173  soft-tissue]
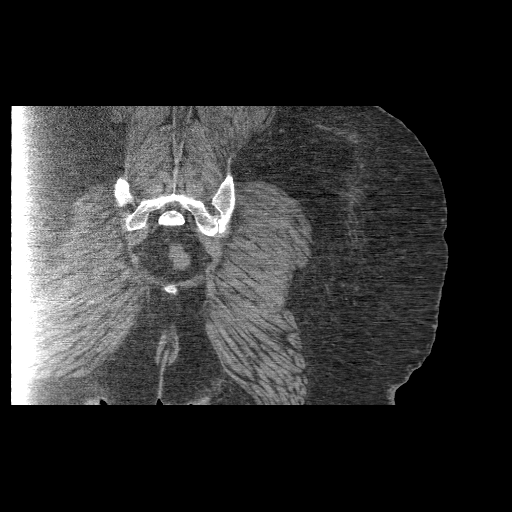

[13 of 46 positions shown; findings below may reference images not displayed]

FINDINGS: As described on report prior MRI there is no evidence of lipoma in
the pelvis. There are no acute musculoskeletal findings. There is
mild bilateral hip arthritis. There is mild bilateral sacroiliitis.

There is mild diverticulosis of the sigmoid colon. There is no
evidence of diverticulitis. Reproductive organs are absent. Bladder
appears normal.
IMPRESSION: No acute findings.  No evidence of lipoma.

## 2015-01-05 ENCOUNTER — Other Ambulatory Visit (HOSPITAL_COMMUNITY): Payer: Self-pay | Admitting: *Deleted

## 2015-01-06 ENCOUNTER — Encounter (HOSPITAL_COMMUNITY): Payer: Medicare Other

## 2015-01-12 ENCOUNTER — Other Ambulatory Visit: Payer: Self-pay | Admitting: Cardiovascular Disease

## 2015-01-12 NOTE — Telephone Encounter (Signed)
Rx(s) sent to pharmacy electronically.  

## 2015-02-02 ENCOUNTER — Encounter: Payer: Self-pay | Admitting: Cardiovascular Disease

## 2015-02-08 ENCOUNTER — Ambulatory Visit (INDEPENDENT_AMBULATORY_CARE_PROVIDER_SITE_OTHER): Payer: Medicare Other | Admitting: Cardiovascular Disease

## 2015-02-08 ENCOUNTER — Encounter: Payer: Self-pay | Admitting: Cardiovascular Disease

## 2015-02-08 VITALS — BP 120/78 | HR 76 | Ht 68.0 in | Wt 261.2 lb

## 2015-02-08 DIAGNOSIS — N186 End stage renal disease: Secondary | ICD-10-CM

## 2015-02-08 DIAGNOSIS — I25810 Atherosclerosis of coronary artery bypass graft(s) without angina pectoris: Secondary | ICD-10-CM | POA: Diagnosis not present

## 2015-02-08 DIAGNOSIS — G4733 Obstructive sleep apnea (adult) (pediatric): Secondary | ICD-10-CM

## 2015-02-08 DIAGNOSIS — E785 Hyperlipidemia, unspecified: Secondary | ICD-10-CM

## 2015-02-08 DIAGNOSIS — I10 Essential (primary) hypertension: Secondary | ICD-10-CM

## 2015-02-08 DIAGNOSIS — E118 Type 2 diabetes mellitus with unspecified complications: Secondary | ICD-10-CM | POA: Diagnosis not present

## 2015-02-08 DIAGNOSIS — I119 Hypertensive heart disease without heart failure: Secondary | ICD-10-CM | POA: Diagnosis not present

## 2015-02-08 NOTE — Patient Instructions (Signed)
Your physician wants you to follow-up in: 6 months or sooner if needed. You will receive a reminder letter in the mail two months in advance. If you don't receive a letter, please call our office to schedule the follow-up appointment. 

## 2015-02-09 ENCOUNTER — Encounter: Payer: Self-pay | Admitting: Cardiovascular Disease

## 2015-02-09 NOTE — Progress Notes (Signed)
Patient ID: Donna Sweeney, female   DOB: [birth year], 69 y.o.   MRN: [id number]     HPI: Donna Sweeney is a 69 y.o. African American female who presents for a 7 month cardiology evaluation.  [name] has a history of obesity, long-standing history of hypertension, congestive heart failure and while she lived in [place] was enrolled in a heart failure clinic. She has a > 30 year history of diabetes mellitus,  a history of ventral hernia, as well as severe obstructive sleep apnea for which he utilizes CPAP therapy. An echo Doppler study in February 2014 showed moderate LVH with an ejection fraction at 60-65%,  grade 1 diastolic dysfunction and his abnormal tissue Doppler E/e' ratio greater than 20 suggesting markedly elevated LV filling pressures. She did have severe LA dilatation by volume assessment and  very mild pulmonary hypertension.  When I last saw her in October 2015 she was experiencing episodes of chest tightness, radiating to her jaw and also some mild shortness of breath and was continuing  to have difficulty with her leg edema.  She underwent a nuclear perfusion study on 06/25/2014 which was interpreted as low risk demonstrating probable anterior soft tissue attenuation defect without ischemia.   She has end-stage renal disease and since I last saw her, started on dialysis on 12/24/2014.  She now sees Dr. Jamal Maes at the dialysis center.  Last year, she had placement of her left arm basilic vein transposition in April 2015 and June 2015 underwent completion of her AV fistula.  She now is on dialysis on Tuesdays, Thursdays and Saturdays.  She admits to decreased energy.  She admits to iron deficiency anemia.  She gets Procrit injections.  She denies chest pain.  She is unaware of palpitations.  She continues to use her CPAP 100% of the time with reference to her obstructive sleep apnea.  She is unaware of breakthrough  snoring.  Past Medical History  Diagnosis Date  .  Hypertension   . CHF (congestive heart failure)   . Constipation   . Hypothyroidism   . GERD (gastroesophageal reflux disease)   . Hyperlipidemia   . Hx of seasonal allergies   . Fibromyalgia   . Arthritis   . Angina at rest   . Asthma   . Diabetes mellitus   . History of cardiac cath [phone]    non obstructive CAD (done in [place]) EF 55%  . Herniated cervical disc   . Sciatica   . Diverticulitis   . Ulcerative colitis   . CAD (coronary artery disease)     non obstructive disease  . Biventricular heart failure   . Echocardiogram abnormal 10/17/12    EF 77-82%, grade I diastolic dysfunction, trivial MR, severely dilated LA  . Myocardial infarction   . Peripheral vascular disease   . Anemia   . Chronic kidney disease     stage 4 chronic kidney disease, lasr saw dr Debroah Baller  . Achilles tendon rupture 07-01-2013    right, wears boot to knee  . Bruises easily     both legs  . OSA (obstructive sleep apnea)     cpap setting of 9  . DVT (deep venous thrombosis)   . Heart murmur   . H/O hiatal hernia     Past Surgical History  Procedure Laterality Date  . Tonsillectomy    . Dilation and curettage of uterus    . Cholecystectomy    . Oophorectomy    . Cystectomy    .  Knee arthroscopy    . Replacement total knee      right  . Foot surgery      right  . Bunionectomy      right  . Hammer toe surgery      right  . Mole removal      right 5th toe  . Foot surgery      joint removed from great toe and pin inserted  . Cardiac catheterization  2013, 2005, 1999    non obstructive CAD  . Abdominal hysterectomy    . Revision total knee arthroplasty Right 2009    had hematoma afterwards  . Esophagogastroduodenoscopy (egd) with propofol N/A 09/04/2013    Procedure: ESOPHAGOGASTRODUODENOSCOPY (EGD) WITH PROPOFOL;  Surgeon: Lear Ng, MD;  Location: [facility];  Service: Endoscopy;  Laterality: N/A;  . Colonoscopy with propofol N/A 09/04/2013    Procedure:  COLONOSCOPY WITH PROPOFOL;  Surgeon: Lear Ng, MD;  Location: [facility];  Service: Endoscopy;  Laterality: N/A;  . Bascilic vein transposition Left 12/05/2013    Procedure: First Stage Bascilic Vein Transpostion;  Surgeon: Serafina Mitchell, MD;  Location: [facility];  Service: Vascular;  Laterality: Left;  . Bascilic vein transposition Left 01/30/2014    DR Moss Mc  . Bascilic vein transposition Left 01/30/2014    Procedure: SECOND STAGE [place];  Surgeon: Serafina Mitchell, MD;  Location: [facility];  Service: Vascular;  Laterality: Left;    Allergies  Allergen Reactions  . Ivp Dye [Iodinated Diagnostic Agents] Shortness Of Breath, Itching and Swelling    Only reaction with IV Dye.  Pt. states iodine on the skin has never caused reaction.   Marland Kitchen Raspberry Shortness Of Breath, Itching and Swelling    Current Outpatient Prescriptions  Medication Sig Dispense Refill  . albuterol (PROVENTIL HFA;VENTOLIN HFA) 108 (90 BASE) MCG/ACT inhaler Inhale 2 puffs into the lungs 2 (two) times daily as needed for shortness of breath. For shortness of breath    . aspirin 81 MG chewable tablet Chew 81 mg by mouth every morning.     . calcium-vitamin D (OSCAL WITH D) 500-200 MG-UNIT per tablet Take 1 tablet by mouth 2 (two) times daily.    . carvedilol (COREG) 25 MG tablet Take 2 tablets (94m) by mouth in the morning and 1.5 tablets (37.551m in the evening 105 tablet 8  . cholecalciferol (VITAMIN D) 1000 UNITS tablet Take 1,000 Units by mouth daily.    . Marland KitchenOLCRYS 0.6 MG tablet Take 1 tablet by mouth 2 (two) times daily.    . DULoxetine (CYMBALTA) 60 MG capsule Take 60 mg by mouth every evening.     . fish oil-omega-3 fatty acids 1000 MG capsule Take 1 g by mouth 3 (three) times daily.    . hydrALAZINE (APRESOLINE) 50 MG tablet Take 25 mg by mouth 2 (two) times daily.    . isosorbide mononitrate (IMDUR) 30 MG 24 hr tablet Take 1 tablet (30 mg total) by mouth daily. 30 tablet 9  .  levothyroxine (SYNTHROID, LEVOTHROID) 100 MCG tablet Take 100 mcg by mouth daily before breakfast.     . nitroGLYCERIN (NITROSTAT) 0.4 MG SL tablet Place 0.4 mg under the tongue every 5 (five) minutes as needed for chest pain. For chest pain    . NOVOLIN N RELION 100 UNIT/ML injection Inject 20 Units into the skin 2 (two) times daily before a meal.     . NOVOLIN R 100 UNIT/ML injection Inject 5 Units into the skin  as directed.    . pantoprazole (PROTONIX) 20 MG tablet Take 20 mg by mouth 2 (two) times daily.    Marland Kitchen RELION INSULIN SYRINGE 1ML/31G 31G X 5/16" 1 ML MISC     . simvastatin (ZOCOR) 40 MG tablet Take 40 mg by mouth every evening.    . SYMBICORT 80-4.5 MCG/ACT inhaler Inhale 2 puffs into the lungs 2 (two) times daily.    Marland Kitchen tiZANidine (ZANAFLEX) 2 MG tablet Take 2 mg by mouth 3 (three) times daily with meals.    . traZODone (DESYREL) 50 MG tablet Take 50 mg by mouth at bedtime.     . valsartan (DIOVAN) 320 MG tablet Take 320 mg by mouth at bedtime.    Marland Kitchen VICTOZA 18 MG/3ML SOPN Inject 1.2 Units into the skin every morning.      No current facility-administered medications for this visit.    History   Social History  . Marital Status: Divorced    Spouse Name: N/A  . Number of Children: N/A  . Years of Education: N/A   Occupational History  . Not on file.   Social History Main Topics  . Smoking status: Never Smoker   . Smokeless tobacco: Never Used  . Alcohol Use: No  . Drug Use: No  . Sexual Activity: No   Other Topics Concern  . Not on file   Social History Narrative    Family History  Problem Relation Age of Onset  . Diabetes type II Mother   . Hypertension Mother   . Hyperlipidemia Mother   . Kidney disease Mother   . Fibroids Mother   . Diabetes Mother   . Heart disease Mother   . Varicose Veins Mother   . Peripheral vascular disease Mother   . Hypertension Sister   . Hypertension Brother   . Hypertension Sister   . Hypertension Sister    ROS General:  Negative; No fevers, chills, or night sweats;  HEENT: Negative; No changes in vision or hearing, sinus congestion, difficulty swallowing Pulmonary: Negative; No cough, wheezing, shortness of breath, hemoptysis Cardiovascular: See history of present illness Positive for leg swelling GI: Negative; No nausea, vomiting, diarrhea, or abdominal pain GU: Negative; No dysuria, hematuria, or difficulty voiding Musculoskeletal: History of ruptured Achilles tendon ;no myalgias, joint pain, or weakness Hematologic/Oncology: Negative; no easy bruising, bleeding Endocrine: Positive for diabetes mellitus, and hypothyroidism Neuro: Negative; no changes in balance, headaches Skin: Negative; No rashes or skin lesions Psychiatric: Negative; No behavioral problems, depression Sleep: Positive for obstructive sleep apnea on CPAP with 100% compliance No snoring, daytime sleepiness, hypersomnolence, bruxism, restless legs, hypnogognic hallucinations, no cataplexy Other comprehensive 14 point system review is negative.    PE BP 120/78 mmHg  Pulse 76  Ht 5' 8"  (1.727 m)  Wt 261 lb 3.2 oz (118.48 kg)  BMI 39.72 kg/m2  Repeat blood pressure 118/70  Wt Readings from Last 3 Encounters:  02/08/15 261 lb 3.2 oz (118.48 kg)  11/10/14 262 lb (118.842 kg)  07/28/14 273 lb 4.8 oz (123.968 kg)  General: Alert, oriented, no distress.  Skin: normal turgor, no rashes HEENT: Normocephalic, atraumatic. Pupils round and reactive; sclera anicteric;no lid lag.  Nose without nasal septal hypertrophy Mouth/Parynx benign; Mallinpatti scale 3/4 Neck: No JVD, no carotid bruits with normal carotid upstrokes. Chest wall: No tenderness to palpation to Lungs: clear to ausculatation and percussion; no wheezing or rales Heart: RRR, s1 s2 normal 1/6 systolic murmur; no S3 gallop. No rub. Abdomen: Moderate central adiposity with  question diastases recti versus a hernia.soft, nontender; no hepatosplenomehaly, BS+; abdominal aorta  nontender and not dilated by palpation. Pulses 2+ Extremities:  Left arm AV fistula; 1+edema ; no clubbing cyanosis, Homan's sign negative  Bilateral ankle braces.  Lower extremity varicosities. Neurologic: grossly nonfocal; cranial nerves grossly normal. Psychological: Normal affect and mood  ECG (independently read by me): Normal sinus rhythm with first-degree AV block, PR interval 212 ms.  Small Q wave in aVL.  Otherwise no signal been ST-T changes.  Probable LVH by voltage criteria in aVL  December 2015 ECG (independently read by me): Normal sinus rhythm with first-degree block.  LVH by voltage criteria.  Nonspecific ST changes.  October 2015 ECG (independently read by me): Normal sinus rhythm at 67 beats per minute.  LVH by voltage criteria. Mild first-degree AV block with a PR interval at 210 ms.  QTc interval 450 ms.  January 2015 ECG (independently read by me): sinus rhythm with premature atrial complexes. LVH by voltage criteria.  Prior ECG 05/28/2013: Sinus rhythm at 77 beats per minute with sinus arrhythmia and occasional PVCs.  LABS: BMP Latest Ref Rng 12/09/2014 12/09/2014 11/11/2014  Glucose 70 - 99 mg/dL 154(H) - 92  BUN 6 - 23 mg/dL 100(H) - 48(H)  Creatinine 0.50 - 1.10 mg/dL 3.09(H) - 2.89(H)  Sodium 135 - 145 mmol/L 137 - 140  Potassium 3.5 - 5.1 mmol/L 4.6 - 4.2  Chloride 96 - 112 mmol/L 97 - 99  CO2 19 - 32 mmol/L 29 - 31  Calcium 8.7 - 10.3 mg/dL 9.6 9.3 10.1   Hepatic Function Latest Ref Rng 12/09/2014 11/11/2014 09/30/2014  Total Protein 6.0 - 8.3 g/dL - - -  Albumin 3.5 - 5.2 g/dL 2.9(L) 3.5 3.3(L)  AST 0 - 37 U/L - - -  ALT 0 - 35 U/L - - -  Alk Phosphatase 39 - 117 U/L - - -  Total Bilirubin 0.3 - 1.2 mg/dL - - -   CBC Latest Ref Rng 12/09/2014 11/11/2014 10/14/2014  WBC 4.0 - 10.5 K/uL - - -  Hemoglobin 12.0 - 15.0 g/dL 9.4(L) 10.3(L) 10.5(L)  Hematocrit 36.0 - 46.0 % - - -  Platelets 150 - 400 K/uL - - -   Lab Results  Component Value Date   MCV 92.0  01/29/2014   MCV 93.3 01/19/2014   MCV 90.2 12/24/2013   No results found for: TSH  Lipid Panel  No results found for: CHOL, TRIG, HDL, CHOLHDL, VLDL, LDLCALC, LDLDIRECT  RADIOLOGY: No results found.    ASSESSMENT AND PLAN: Ms. Takyra Cantrall is a 69 year old African American female with a history of obesity, hypertension with documented normal systolic function with grade 1 diastolic dysfunction with elevated filling pressures by tissue Doppler.  She has evolved to develop end-stage renal disease and is now on dialysis since 12/24/2014.  Her blood pressure today is controlled.  She now is on reduced medication and is taking hydralazine 25 mg twice a day, isosorbide 30 mg, along 50 mg in the morning and 37.5 mg in the evening.  She is on Protonix for GERD.  She is taking simvastatin for hyperlipidemia and is tolerating this well.  She gets Procrit injections for anemia.  She tells me she is seeing Dr. Victorino Dike and will undergo treatment for varicose veins in her leg later this month.  She continues to use CPAP with 100% compliance.  She denies PND, orthopnea.  At present she does not have overt CHF findings.  She'll continue  with her dialysis as scheduled.  His only she remains stable, I will see her in 6 months for reevaluation or sooner if problems arise.  Time spent: 25 minutes   Troy Sine, MD, [facility]  02/09/2015 8:09 PM

## 2015-02-12 ENCOUNTER — Encounter: Payer: Self-pay | Admitting: Vascular Surgery

## 2015-02-16 ENCOUNTER — Ambulatory Visit (INDEPENDENT_AMBULATORY_CARE_PROVIDER_SITE_OTHER): Payer: Medicare Other | Admitting: Vascular Surgery

## 2015-02-16 ENCOUNTER — Encounter: Payer: Self-pay | Admitting: Vascular Surgery

## 2015-02-16 VITALS — BP 127/68 | HR 70 | Resp 16 | Ht 68.0 in | Wt 261.0 lb

## 2015-02-16 DIAGNOSIS — I25810 Atherosclerosis of coronary artery bypass graft(s) without angina pectoris: Secondary | ICD-10-CM | POA: Diagnosis not present

## 2015-02-16 DIAGNOSIS — I83893 Varicose veins of bilateral lower extremities with other complications: Secondary | ICD-10-CM | POA: Insufficient documentation

## 2015-02-16 NOTE — Progress Notes (Signed)
Subjective:     Patient ID: Donna Sweeney, female   DOB: June 19, 1946, 69 y.o.   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 She continues to have aching throbbing and burning discomfort in the right leg with edema which worsens as the day progresses. She has tried Patent examiner elastic compression stockings and elevation without success. 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   Past Medical History  Diagnosis Date  . Hypertension   . CHF (congestive heart failure)   . Constipation   . Hypothyroidism   . GERD (gastroesophageal reflux disease)   . Hyperlipidemia   . Hx of seasonal allergies   . Fibromyalgia   . Arthritis   . Angina at rest   . Asthma   . Diabetes mellitus   . History of cardiac cath [phone]    non obstructive CAD (done in [place]) EF 55%  . Herniated cervical disc   . Sciatica   . Diverticulitis   . Ulcerative colitis   . CAD (coronary artery disease)     non obstructive disease  . Biventricular heart failure   . Echocardiogram abnormal 10/17/12    EF 85-88%, grade I diastolic dysfunction, trivial MR, severely dilated LA  . Myocardial infarction   . Peripheral vascular disease   . Anemia   . Chronic kidney disease     stage 4 chronic kidney disease, lasr saw dr Debroah Baller  . Achilles tendon rupture 07-01-2013    right, wears boot to knee  . Bruises easily     both legs  . OSA (obstructive sleep apnea)     cpap setting of 9  . DVT (deep venous thrombosis)   . Heart murmur   . H/O hiatal hernia     History  Substance Use Topics  . Smoking status: Never Smoker   . Smokeless tobacco: Never Used  . Alcohol Use: No    Family History  Problem Relation Age of Onset  . Diabetes type II Mother   . Hypertension Mother   . Hyperlipidemia Mother   . Kidney disease Mother   . Fibroids Mother   . Diabetes Mother   . Heart disease Mother   . Varicose Veins  Mother   . Peripheral vascular disease Mother   . Hypertension Sister   . Hypertension Brother   . Hypertension Sister   . Hypertension Sister     Allergies  Allergen Reactions  . Ivp Dye [Iodinated Diagnostic Agents] Shortness Of Breath, Itching and Swelling    Only reaction with IV Dye.  Pt. states iodine on the skin has never caused reaction.   Marland Kitchen Raspberry Shortness Of Breath, Itching and Swelling     Current outpatient prescriptions:  .  albuterol (PROVENTIL HFA;VENTOLIN HFA) 108 (90 BASE) MCG/ACT inhaler, Inhale 2 puffs into the lungs 2 (two) times daily as needed for shortness of breath. For shortness of breath, Disp: , Rfl:  .  aspirin 81 MG chewable tablet, Chew 81 mg by mouth every morning. , Disp: , Rfl:  .  calcium-vitamin D (OSCAL WITH D) 500-200 MG-UNIT per tablet, Take 1 tablet by mouth 2 (two) times daily., Disp: , Rfl:  .  carvedilol (COREG) 25 MG tablet, Take 2 tablets (50mg ) by mouth in the morning and 1.5 tablets (37.5mg ) in the evening, Disp: 105 tablet, Rfl: 8 .  cholecalciferol (VITAMIN D) 1000 UNITS tablet, Take 1,000 Units by mouth daily., Disp: ,  Rfl:  .  COLCRYS 0.6 MG tablet, Take 1 tablet by mouth 2 (two) times daily., Disp: , Rfl:  .  dicyclomine (BENTYL) 20 MG tablet, Take 20 mg by mouth every 6 (six) hours., Disp: , Rfl:  .  DULoxetine (CYMBALTA) 60 MG capsule, Take 60 mg by mouth every evening. , Disp: , Rfl:  .  fish oil-omega-3 fatty acids 1000 MG capsule, Take 1 g by mouth 3 (three) times daily., Disp: , Rfl:  .  hydrALAZINE (APRESOLINE) 50 MG tablet, Take 25 mg by mouth 2 (two) times daily., Disp: , Rfl:  .  isosorbide mononitrate (IMDUR) 30 MG 24 hr tablet, Take 1 tablet (30 mg total) by mouth daily., Disp: 30 tablet, Rfl: 9 .  levothyroxine (SYNTHROID, LEVOTHROID) 100 MCG tablet, Take 100 mcg by mouth daily before breakfast. , Disp: , Rfl:  .  nitroGLYCERIN (NITROSTAT) 0.4 MG SL tablet, Place 0.4 mg under the tongue every 5 (five) minutes as needed  for chest pain. For chest pain, Disp: , Rfl:  .  NOVOLIN N RELION 100 UNIT/ML injection, Inject 20 Units into the skin 2 (two) times daily before a meal. , Disp: , Rfl:  .  NOVOLIN R 100 UNIT/ML injection, Inject 5 Units into the skin as directed., Disp: , Rfl:  .  pantoprazole (PROTONIX) 20 MG tablet, Take 20 mg by mouth 2 (two) times daily., Disp: , Rfl:  .  RELION INSULIN SYRINGE 1ML/31G 31G X 5/16" 1 ML MISC, , Disp: , Rfl:  .  simvastatin (ZOCOR) 40 MG tablet, Take 40 mg by mouth every evening., Disp: , Rfl:  .  SYMBICORT 80-4.5 MCG/ACT inhaler, Inhale 2 puffs into the lungs 2 (two) times daily., Disp: , Rfl:  .  tiZANidine (ZANAFLEX) 2 MG tablet, Take 2 mg by mouth 3 (three) times daily with meals., Disp: , Rfl:  .  traZODone (DESYREL) 50 MG tablet, Take 50 mg by mouth at bedtime. , Disp: , Rfl:  .  valsartan (DIOVAN) 320 MG tablet, Take 320 mg by mouth at bedtime., Disp: , Rfl:  .  VICTOZA 18 MG/3ML SOPN, Inject 1.2 Units into the skin every morning. , Disp: , Rfl:   Filed Vitals:   02/16/15 0919  BP: 127/68  Pulse: 70  Resp: 16  Height: 5\' 8"  (1.727 m)  Weight: 261 lb (118.389 kg)    Body mass index is 39.69 kg/(m^2).          Review of Systems Patient is on hemodialysis through silicone vein transposition performed by Dr. Trula Slade. Also has history of gastroesophageal reflux disease and remote history of conestive heart failure.     Objective:   Physical Exam BP 127/68 mmHg  Pulse 70  Resp 16  Ht 5\' 8"  (1.727 m)  Wt 261 lb (118.389 kg)  BMI 39.69 kg/m2   Gen. Well-developed well-nourished female in no apparent distress alert and oriented 3 Lungs no rhonchi or wheezing Right leg with diffuse edema and bulging varicosities in the medial thigh and calf. No active ulceration noted.   Patient has documented gross reflux throughout right great saphenous vein supplying these painful varicosities     Assessment:      painful varicosities right leg due to gross  reflux right great saphenous system. Symptoms are resistant to conservative measures and affecting patient's daily living     Plan:      patient needs laser ablation right great saphenous vein and then return in 3 months to see if stab  phlebectomy of painful varicosities will be indicated We'll proceed with precertification to perform this in the near future

## 2015-03-01 ENCOUNTER — Other Ambulatory Visit: Payer: Self-pay | Admitting: *Deleted

## 2015-03-01 DIAGNOSIS — I83811 Varicose veins of right lower extremities with pain: Secondary | ICD-10-CM

## 2015-03-04 ENCOUNTER — Encounter: Payer: Self-pay | Admitting: Vascular Surgery

## 2015-03-08 ENCOUNTER — Encounter: Payer: Self-pay | Admitting: Vascular Surgery

## 2015-03-08 ENCOUNTER — Ambulatory Visit (INDEPENDENT_AMBULATORY_CARE_PROVIDER_SITE_OTHER): Payer: Medicare Other | Admitting: Vascular Surgery

## 2015-03-08 VITALS — BP 117/69 | HR 70 | Resp 18 | Ht 68.0 in | Wt 260.0 lb

## 2015-03-08 DIAGNOSIS — I83893 Varicose veins of bilateral lower extremities with other complications: Secondary | ICD-10-CM

## 2015-03-08 NOTE — Progress Notes (Signed)
Subjective:     Patient ID: Donna Sweeney, female   DOB: 12-27-1945, 69 y.o.   MRN: [id number]  HPI  This 69 year old female had laser ablation of the right great saphenous vein from the distal thigh to near the saphenofemoral junction performed under local tumescent anesthesia. A total of 1811 J of energy was utilized. She tolerated the procedure well.  Review of Systems     Objective:   Physical Exam BP 117/69 mmHg  Pulse 70  Resp 18  Ht 5\' 8"  (1.727 m)  Wt 260 lb (117.935 kg)  BMI 39.54 kg/m2       Assessment:      well tolerated laser ablation right great saphenous vein performed for gross reflux with chronic edema and pain performed under local tumescent anesthesia     Plan:      return in 1 week for venous duplex exam to confirm closure right great saphenous vein

## 2015-03-08 NOTE — Progress Notes (Signed)
Laser Ablation Procedure    Date: 03/08/2015   Donna Sweeney DOB:03-Jul-1946  Consent signed: Yes    Surgeon:  Dr. Nelda Severe. Kellie Simmering  Procedure: Laser Ablation: right Greater Saphenous Vein  BP 117/69 mmHg  Pulse 70  Resp 18  Ht 5\' 8"  (1.727 m)  Wt 260 lb (117.935 kg)  BMI 39.54 kg/m2  Tumescent Anesthesia: 480 cc 0.9% NaCl with 50 cc Lidocaine HCL with 1% Epi and 15 cc 8.4% NaHCO3  Local Anesthesia: 3 cc Lidocaine HCL and NaHCO3 (ratio 2:1)  Pulsed Mode: 15 watts, 511ms delay, 1.0 duration  Total Energy:   1811           Total Pulses:   121             Total Time: 2:01    Patient tolerated procedure well  Notes:   Description of Procedure:  After marking the course of the secondary varicosities, the patient was placed on the operating table in the supine position, and the right leg was prepped and draped in sterile fashion.   Local anesthetic was administered and under ultrasound guidance the saphenous vein was accessed with a micro needle and guide wire; then the mirco puncture sheath was placed.  A guide wire was inserted saphenofemoral junction , followed by a 5 french sheath.  The position of the sheath and then the laser fiber below the junction was confirmed using the ultrasound.  Tumescent anesthesia was administered along the course of the saphenous vein using ultrasound guidance. The patient was placed in Trendelenburg position and protective laser glasses were placed on patient and staff, and the laser was fired at 15 watts continuous mode advancing 1-83mm/second for a total of 1811 joules.     Steri strips were applied to the stab wounds and ABD pads and thigh high compression stockings were applied.  Ace wrap bandages were applied over the phlebectomy sites and at the top of the saphenofemoral junction. Blood loss was less than 15 cc.  The patient ambulated out of the operating room having tolerated the procedure well.

## 2015-03-09 ENCOUNTER — Telehealth: Payer: Self-pay | Admitting: *Deleted

## 2015-03-09 NOTE — Telephone Encounter (Signed)
Pt doing well. No problems or concerns. Following all instructions. Reminded her of her fu appt.

## 2015-03-10 ENCOUNTER — Encounter: Payer: Self-pay | Admitting: Vascular Surgery

## 2015-03-11 ENCOUNTER — Encounter: Payer: Self-pay | Admitting: Vascular Surgery

## 2015-03-15 ENCOUNTER — Ambulatory Visit (INDEPENDENT_AMBULATORY_CARE_PROVIDER_SITE_OTHER): Payer: Medicare Other | Admitting: Vascular Surgery

## 2015-03-15 ENCOUNTER — Encounter: Payer: Self-pay | Admitting: Vascular Surgery

## 2015-03-15 ENCOUNTER — Ambulatory Visit (HOSPITAL_COMMUNITY)
Admission: RE | Admit: 2015-03-15 | Discharge: 2015-03-15 | Disposition: A | Discharge status: Home / Self Care | Payer: Medicare Other | Source: Ambulatory Visit | Attending: Vascular Surgery | Admitting: Vascular Surgery

## 2015-03-15 VITALS — BP 125/72 | HR 98 | Temp 97.9°F | Resp 16 | Ht 68.0 in | Wt 260.0 lb

## 2015-03-15 DIAGNOSIS — I83891 Varicose veins of right lower extremities with other complications: Secondary | ICD-10-CM

## 2015-03-15 DIAGNOSIS — I83811 Varicose veins of right lower extremities with pain: Secondary | ICD-10-CM

## 2015-03-15 DIAGNOSIS — I25810 Atherosclerosis of coronary artery bypass graft(s) without angina pectoris: Secondary | ICD-10-CM | POA: Diagnosis not present

## 2015-03-15 NOTE — Progress Notes (Signed)
Subjective:     Patient ID: Donna Sweeney, female   DOB: Jul 10, 1946, 69 y.o.   MRN: [id number]  HPI this 69 year old female returns 1 week post-laser ablation right great saphenous vein for painful varicosities and swelling. She had gross reflux which was treated with laser ablation successfully. She states that the varicosities are much less tense than they were prior to the procedure. She has slightly less edema distally. She has worn elastic compression stocking as instructed. She has had mild to moderate discomfort in the medial thigh area as one would expect.  Past Medical History  Diagnosis Date  . Hypertension   . CHF (congestive heart failure)   . Constipation   . Hypothyroidism   . GERD (gastroesophageal reflux disease)   . Hyperlipidemia   . Hx of seasonal allergies   . Fibromyalgia   . Arthritis   . Angina at rest   . Asthma   . Diabetes mellitus   . History of cardiac cath [phone]    non obstructive CAD (done in [place]) EF 55%  . Herniated cervical disc   . Sciatica   . Diverticulitis   . Ulcerative colitis   . CAD (coronary artery disease)     non obstructive disease  . Biventricular heart failure   . Echocardiogram abnormal 10/17/12    EF 73-71%, grade I diastolic dysfunction, trivial MR, severely dilated LA  . Myocardial infarction   . Peripheral vascular disease   . Anemia   . Chronic kidney disease     stage 4 chronic kidney disease, lasr saw dr Debroah Baller  . Achilles tendon rupture 07-01-2013    right, wears boot to knee  . Bruises easily     both legs  . OSA (obstructive sleep apnea)     cpap setting of 9  . DVT (deep venous thrombosis)   . Heart murmur   . H/O hiatal hernia   . Varicose veins     History  Substance Use Topics  . Smoking status: Never Smoker   . Smokeless tobacco: Never Used  . Alcohol Use: No    Family History  Problem Relation Age of Onset  . Diabetes type II Mother   . Hypertension Mother   . Hyperlipidemia  Mother   . Kidney disease Mother   . Fibroids Mother   . Diabetes Mother   . Heart disease Mother   . Varicose Veins Mother   . Peripheral vascular disease Mother   . Hypertension Sister   . Hypertension Brother   . Hypertension Sister   . Hypertension Sister     Allergies  Allergen Reactions  . Ivp Dye [Iodinated Diagnostic Agents] Shortness Of Breath, Itching and Swelling    Only reaction with IV Dye.  Pt. states iodine on the skin has never caused reaction.   Marland Kitchen Raspberry Shortness Of Breath, Itching and Swelling     Current outpatient prescriptions:  .  albuterol (PROVENTIL HFA;VENTOLIN HFA) 108 (90 BASE) MCG/ACT inhaler, Inhale 2 puffs into the lungs 2 (two) times daily as needed for shortness of breath. For shortness of breath, Disp: , Rfl:  .  aspirin 81 MG chewable tablet, Chew 81 mg by mouth every morning. , Disp: , Rfl:  .  calcium-vitamin D (OSCAL WITH D) 500-200 MG-UNIT per tablet, Take 1 tablet by mouth 2 (two) times daily., Disp: , Rfl:  .  carvedilol (COREG) 25 MG tablet, Take 2 tablets (50mg ) by mouth in the morning and 1.5 tablets (37.5mg ) in the  evening, Disp: 105 tablet, Rfl: 8 .  cholecalciferol (VITAMIN D) 1000 UNITS tablet, Take 1,000 Units by mouth daily., Disp: , Rfl:  .  COLCRYS 0.6 MG tablet, Take 1 tablet by mouth 2 (two) times daily., Disp: , Rfl:  .  dicyclomine (BENTYL) 20 MG tablet, Take 20 mg by mouth every 6 (six) hours., Disp: , Rfl:  .  DULoxetine (CYMBALTA) 60 MG capsule, Take 60 mg by mouth every evening. , Disp: , Rfl:  .  fish oil-omega-3 fatty acids 1000 MG capsule, Take 1 g by mouth 3 (three) times daily., Disp: , Rfl:  .  hydrALAZINE (APRESOLINE) 50 MG tablet, Take 25 mg by mouth 2 (two) times daily., Disp: , Rfl:  .  isosorbide mononitrate (IMDUR) 30 MG 24 hr tablet, Take 1 tablet (30 mg total) by mouth daily., Disp: 30 tablet, Rfl: 9 .  levothyroxine (SYNTHROID, LEVOTHROID) 100 MCG tablet, Take 100 mcg by mouth daily before breakfast. ,  Disp: , Rfl:  .  nitroGLYCERIN (NITROSTAT) 0.4 MG SL tablet, Place 0.4 mg under the tongue every 5 (five) minutes as needed for chest pain. For chest pain, Disp: , Rfl:  .  NOVOLIN N RELION 100 UNIT/ML injection, Inject 20 Units into the skin 2 (two) times daily before a meal. , Disp: , Rfl:  .  NOVOLIN R 100 UNIT/ML injection, Inject 5 Units into the skin as directed., Disp: , Rfl:  .  pantoprazole (PROTONIX) 20 MG tablet, Take 20 mg by mouth 2 (two) times daily., Disp: , Rfl:  .  RELION INSULIN SYRINGE 1ML/31G 31G X 5/16" 1 ML MISC, , Disp: , Rfl:  .  simvastatin (ZOCOR) 40 MG tablet, Take 40 mg by mouth every evening., Disp: , Rfl:  .  SYMBICORT 80-4.5 MCG/ACT inhaler, Inhale 2 puffs into the lungs 2 (two) times daily., Disp: , Rfl:  .  tiZANidine (ZANAFLEX) 2 MG tablet, Take 2 mg by mouth 3 (three) times daily with meals., Disp: , Rfl:  .  traZODone (DESYREL) 50 MG tablet, Take 50 mg by mouth at bedtime. , Disp: , Rfl:  .  valsartan (DIOVAN) 320 MG tablet, Take 320 mg by mouth at bedtime., Disp: , Rfl:  .  VICTOZA 18 MG/3ML SOPN, Inject 1.2 Units into the skin every morning. , Disp: , Rfl:   Filed Vitals:   03/15/15 1435  BP: 125/72  Pulse: 98  Temp: 97.9 F (36.6 C)  Resp: 16  Height: 5\' 8"  (1.727 m)  Weight: 260 lb (117.935 kg)  SpO2: 96%    Body mass index is 39.54 kg/(m^2).         Review of Systems denies chest pain, hemoptysis, PND, orthopnea, claudication.     Objective:   Physical Exam BP 125/72 mmHg  Pulse 98  Temp(Src) 97.9 F (36.6 C)  Resp 16  Ht 5\' 8"  (1.727 m)  Wt 260 lb (117.935 kg)  BMI 39.54 kg/m2  SpO2 96%  General obese female in no apparent distress alert and oriented 3 Lungs no rhonchi or wheezing Cardiovascular regular rhythm no murmurs Right leg with mild tenderness to palpation along course of great saphenous vein from distal thigh to near the saphenofemoral junction. Bulging varicosities and proximal medial right calf area. 1+ distal  edema with 3+ dorsalis pedis pulse palpable.  Today I ordered a venous duplex exam of the right leg which I reviewed and interpreted. There is no DVT. There is total closure of the right great saphenous vein from the distal  thigh to near the saphenofemoral junction.     Assessment:     Successful laser ablation right great saphenous vein performed for gross reflux with painful varicosities and distal edema    Plan:     Return in 3 months to see if stab phlebectomy of residual painful varicosities will be indicated

## 2015-06-11 ENCOUNTER — Encounter: Payer: Self-pay | Admitting: Vascular Surgery

## 2015-06-15 ENCOUNTER — Ambulatory Visit: Payer: Medicare Other | Admitting: Vascular Surgery

## 2015-06-25 ENCOUNTER — Encounter: Payer: Self-pay | Admitting: Vascular Surgery

## 2015-06-29 ENCOUNTER — Ambulatory Visit (INDEPENDENT_AMBULATORY_CARE_PROVIDER_SITE_OTHER): Payer: Medicare Other | Admitting: Vascular Surgery

## 2015-06-29 ENCOUNTER — Encounter: Payer: Self-pay | Admitting: Vascular Surgery

## 2015-06-29 VITALS — BP 162/105 | HR 82 | Temp 98.2°F | Resp 16 | Ht 68.0 in | Wt 261.0 lb

## 2015-06-29 DIAGNOSIS — I83891 Varicose veins of right lower extremities with other complications: Secondary | ICD-10-CM

## 2015-06-29 DIAGNOSIS — I25810 Atherosclerosis of coronary artery bypass graft(s) without angina pectoris: Secondary | ICD-10-CM | POA: Diagnosis not present

## 2015-06-29 NOTE — Progress Notes (Signed)
Filed Vitals:   06/29/15 1547 06/29/15 1557  BP: 174/100 162/105  Pulse: 72 82  Temp: 98.2 F (36.8 C)   TempSrc: Oral   Resp: 16   Height: 5\' 8"  (1.727 m)   Weight: 261 lb (118.389 kg)   SpO2: 99%

## 2015-06-29 NOTE — Progress Notes (Signed)
Subjective:     Patient ID: Donna Sweeney, female   DOB: 1946/03/03, 69 y.o.   MRN: [id number]  HPI this 69 year old female returns 3 months post laser ablation right great saphenous vein for painful varicosities and swelling. She states the swelling is much better than previously. She states the varicose veins are less visible. She is not wearing elastic compression stockings because of the size of her legs. She denies any interval history of DVT or thrombophlebitis stasis ulcers or bleeding.  Past Medical History  Diagnosis Date  . Hypertension   . CHF (congestive heart failure) ([place])   . Constipation   . Hypothyroidism   . GERD (gastroesophageal reflux disease)   . Hyperlipidemia   . Hx of seasonal allergies   . Fibromyalgia   . Arthritis   . Angina at rest [facility])   . Asthma   . Diabetes mellitus   . History of cardiac cath [phone]    non obstructive CAD (done in [place]) EF 55%  . Herniated cervical disc   . Sciatica   . Diverticulitis   . Ulcerative colitis ([place])   . CAD (coronary artery disease)     non obstructive disease  . Biventricular heart failure ([place])   . Echocardiogram abnormal 10/17/12    EF 03-47%, grade I diastolic dysfunction, trivial MR, severely dilated LA  . Myocardial infarction ([place])   . Peripheral vascular disease ([place])   . Anemia   . Chronic kidney disease     stage 4 chronic kidney disease, lasr saw dr Debroah Baller  . Achilles tendon rupture 07-01-2013    right, wears boot to knee  . Bruises easily     both legs  . OSA (obstructive sleep apnea)     cpap setting of 9  . DVT (deep venous thrombosis) ([place])   . Heart murmur   . H/O hiatal hernia   . Varicose veins     Social History  Substance Use Topics  . Smoking status: Never Smoker   . Smokeless tobacco: Never Used  . Alcohol Use: No    Family History  Problem Relation Age of Onset  . Diabetes type II Mother   . Hypertension Mother   . Hyperlipidemia Mother   . Kidney disease  Mother   . Fibroids Mother   . Diabetes Mother   . Heart disease Mother   . Varicose Veins Mother   . Peripheral vascular disease Mother   . Hypertension Sister   . Hypertension Brother   . Hypertension Sister   . Hypertension Sister     Allergies  Allergen Reactions  . Ivp Dye [Iodinated Diagnostic Agents] Shortness Of Breath, Itching and Swelling    Only reaction with IV Dye.  Pt. states iodine on the skin has never caused reaction.   Marland Kitchen Raspberry Shortness Of Breath, Itching and Swelling     Current outpatient prescriptions:  .  aspirin 81 MG chewable tablet, Chew 81 mg by mouth every morning. , Disp: , Rfl:  .  carvedilol (COREG) 25 MG tablet, Take 2 tablets (50mg ) by mouth in the morning and 1.5 tablets (37.5mg ) in the evening (Patient taking differently: 25 mg. Take 2 tablets (50mg ) by mouth in the morning and 1.5 tablets (37.5mg ) in the evening), Disp: 105 tablet, Rfl: 8 .  cetirizine (ZYRTEC) 10 MG tablet, Take 10 mg by mouth daily., Disp: , Rfl:  .  cholecalciferol (VITAMIN D) 1000 UNITS tablet, Take 1,000 Units by mouth daily., Disp: , Rfl:  .  cinacalcet (SENSIPAR) 30 MG tablet, Take 30 mg by mouth daily., Disp: , Rfl:  .  COLCRYS 0.6 MG tablet, Take 1 tablet by mouth 2 (two) times daily., Disp: , Rfl:  .  dicyclomine (BENTYL) 20 MG tablet, Take 20 mg by mouth every 6 (six) hours., Disp: , Rfl:  .  DULoxetine (CYMBALTA) 60 MG capsule, Take 60 mg by mouth every evening. , Disp: , Rfl:  .  fish oil-omega-3 fatty acids 1000 MG capsule, Take 1 g by mouth 3 (three) times daily., Disp: , Rfl:  .  gabapentin (NEURONTIN) 300 MG capsule, Take 300 mg by mouth 3 (three) times daily., Disp: , Rfl:  .  hydrALAZINE (APRESOLINE) 50 MG tablet, Take 25 mg by mouth 2 (two) times daily., Disp: , Rfl:  .  isosorbide mononitrate (IMDUR) 30 MG 24 hr tablet, Take 1 tablet (30 mg total) by mouth daily., Disp: 30 tablet, Rfl: 9 .  levothyroxine (SYNTHROID, LEVOTHROID) 100 MCG tablet, Take 100 mcg  by mouth daily before breakfast. , Disp: , Rfl:  .  nitroGLYCERIN (NITROSTAT) 0.4 MG SL tablet, Place 0.4 mg under the tongue every 5 (five) minutes as needed for chest pain. For chest pain, Disp: , Rfl:  .  NOVOLIN N RELION 100 UNIT/ML injection, Inject 20 Units into the skin 2 (two) times daily before a meal. , Disp: , Rfl:  .  NOVOLIN R 100 UNIT/ML injection, Inject 5 Units into the skin as directed., Disp: , Rfl:  .  pantoprazole (PROTONIX) 20 MG tablet, Take 20 mg by mouth 2 (two) times daily., Disp: , Rfl:  .  RELION INSULIN SYRINGE 1ML/31G 31G X 5/16" 1 ML MISC, , Disp: , Rfl:  .  sevelamer carbonate (RENVELA) 800 MG tablet, Take 800 mg by mouth 3 (three) times daily with meals., Disp: , Rfl:  .  simvastatin (ZOCOR) 40 MG tablet, Take 40 mg by mouth every evening., Disp: , Rfl:  .  SYMBICORT 80-4.5 MCG/ACT inhaler, Inhale 2 puffs into the lungs 2 (two) times daily., Disp: , Rfl:  .  tiZANidine (ZANAFLEX) 2 MG tablet, Take 2 mg by mouth 3 (three) times daily with meals., Disp: , Rfl:  .  traZODone (DESYREL) 50 MG tablet, Take 50 mg by mouth at bedtime. , Disp: , Rfl:  .  valsartan (DIOVAN) 320 MG tablet, Take 320 mg by mouth at bedtime., Disp: , Rfl:  .  VICTOZA 18 MG/3ML SOPN, Inject 1.2 Units into the skin every morning. , Disp: , Rfl:  .  albuterol (PROVENTIL HFA;VENTOLIN HFA) 108 (90 BASE) MCG/ACT inhaler, Inhale 2 puffs into the lungs 2 (two) times daily as needed for shortness of breath. For shortness of breath, Disp: , Rfl:  .  calcium-vitamin D (OSCAL WITH D) 500-200 MG-UNIT per tablet, Take 1 tablet by mouth 2 (two) times daily., Disp: , Rfl:   Filed Vitals:   06/29/15 1547 06/29/15 1557  BP: 174/100 162/105  Pulse: 72 82  Temp: 98.2 F (36.8 C)   TempSrc: Oral   Resp: 16   Height: 5\' 8"  (1.727 m)   Weight: 261 lb (118.389 kg)   SpO2: 99%     Body mass index is 39.69 kg/(m^2).            Review of Systems has occasional chest discomfort, dyspnea on exertion,  has end-stage renal disease on hemodialysis through left arm fistula.     Objective:   Physical Exam BP 162/105 mmHg  Pulse 82  Temp(Src) 98.2  F (36.8 C) (Oral)  Resp 16  Ht 5\' 8"  (1.727 m)  Wt 261 lb (118.389 kg)  BMI 39.69 kg/m2  SpO2 99%  General obese female in no apparent distress alert and oriented 3 Lungs no rhonchi or wheezing Right leg with no tenderness over great saphenous vein. 1+ chronic edema. Varicosities in posterior and medial calf are much less intense than previously noted.     Assessment:     Good decompression of varicosities right leg 3 months post laser ablation right great saphenous vein-asymptomatic and improvement in lower extremity edema    Plan:     No need for further procedures for venous system of right leg Return on when necessary basis

## 2015-07-06 HISTORY — PX: OTHER SURGICAL HISTORY: SHX169

## 2015-08-26 ENCOUNTER — Other Ambulatory Visit: Payer: Self-pay | Admitting: Cardiovascular Disease

## 2015-08-26 NOTE — Telephone Encounter (Signed)
REFILL 

## 2015-09-22 ENCOUNTER — Other Ambulatory Visit: Payer: Self-pay | Admitting: Cardiovascular Disease

## 2015-10-26 ENCOUNTER — Other Ambulatory Visit: Payer: Self-pay | Admitting: Cardiovascular Disease

## 2015-10-26 NOTE — Telephone Encounter (Signed)
REFILL 

## 2015-11-10 ENCOUNTER — Other Ambulatory Visit: Payer: Self-pay | Admitting: Cardiovascular Disease

## 2015-11-10 NOTE — Telephone Encounter (Signed)
Rx request sent to pharmacy.  

## 2015-11-15 ENCOUNTER — Ambulatory Visit (INDEPENDENT_AMBULATORY_CARE_PROVIDER_SITE_OTHER): Payer: Medicare Other | Admitting: Physician Assistant

## 2015-11-15 ENCOUNTER — Encounter: Payer: Self-pay | Admitting: Physician Assistant

## 2015-11-15 VITALS — BP 104/62 | HR 72 | Ht 68.0 in | Wt 275.0 lb

## 2015-11-15 DIAGNOSIS — I2510 Atherosclerotic heart disease of native coronary artery without angina pectoris: Secondary | ICD-10-CM | POA: Diagnosis not present

## 2015-11-15 DIAGNOSIS — E785 Hyperlipidemia, unspecified: Secondary | ICD-10-CM

## 2015-11-15 DIAGNOSIS — I2583 Coronary atherosclerosis due to lipid rich plaque: Principal | ICD-10-CM

## 2015-11-15 DIAGNOSIS — G4733 Obstructive sleep apnea (adult) (pediatric): Secondary | ICD-10-CM | POA: Diagnosis not present

## 2015-11-15 DIAGNOSIS — I119 Hypertensive heart disease without heart failure: Secondary | ICD-10-CM

## 2015-11-15 DIAGNOSIS — I10 Essential (primary) hypertension: Secondary | ICD-10-CM

## 2015-11-15 DIAGNOSIS — E669 Obesity, unspecified: Secondary | ICD-10-CM

## 2015-11-15 NOTE — Progress Notes (Signed)
Patient ID: Donna Sweeney, female   DOB: 08-22-1945, 70 y.o.   MRN: [id number]    Date:  11/15/2015   ID:  Donna Sweeney, DOB [birth year], MRN [id number]  PCP:  Andria Frames, MD  Primary Cardiologist:  Claiborne Billings   chief complaint: one-year evaluation   History of Present Illness: Donna Sweeney is a 70 y.o. female  with a history of obesity, long-standing hypertension, congestive heart failure and while she lived in [place] was enrolled in a heart failure clinic. She has a > 30 year history of diabetes mellitus, a history of ventral hernia, as well as severe obstructive sleep apnea for which he utilizes CPAP therapy.  An echo Doppler study in February 2014 showed moderate LVH with an ejection fraction at 123456, grade 1 diastolic dysfunction and his abnormal tissue Doppler E/e' ratio greater than 20 suggesting markedly elevated LV filling pressures. She did have severe LA dilatation by volume assessment and very mild pulmonary hypertension.  When seen in October 2015 she was experiencing episodes of chest tightness, radiating to her jaw and also some mild shortness of breath and was continuing to have difficulty with her leg edema. She underwent a nuclear perfusion study on 06/25/2014 which was interpreted as low risk demonstrating probable anterior soft tissue attenuation defect without ischemia.   She has end-stage renal disease and was started on dialysis on 12/24/2014. She now sees Dr. Jamal Sweeney at the dialysis center. Last year, she had placement of her left arm basilic vein transposition in April 2015 and June 2015 underwent completion of her AV fistula. She now is on dialysis on Tuesdays, Thursdays and Saturdays.  She continues to use her CPAP 100% of the time.   Donna Sweeney presents for annual evaluation. She has problems with hypotension during dialysis. And as early as 2 months ago she had issues with hypertensive urgency.   On the morning of dialysis she  does not take her Coreg or hydralazine and sometimes has to hold it after dialysis as well.   She has intermittent issues with chest pain for which she takes her nitroglycerin it. It generally occurs when she is at rest  Does not occur with exertion.   She does not exercise at all based on today's weight she is up 14 pounds  The patient currently denies nausea, vomiting, fever, shortness of breath, orthopnea, dizziness, PND, cough, congestion, abdominal pain, hematochezia, melena, lower extremity edema, claudication.  Wt Readings from Last 3 Encounters:  11/15/15 275 lb (124.739 kg)  06/29/15 261 lb (118.389 kg)  03/15/15 260 lb (117.935 kg)     Past Medical History  Diagnosis Date  . Hypertension   . CHF (congestive heart failure) ([place])   . Constipation   . Hypothyroidism   . GERD (gastroesophageal reflux disease)   . Hyperlipidemia   . Hx of seasonal allergies   . Fibromyalgia   . Arthritis   . Angina at rest [facility])   . Asthma   . Diabetes mellitus   . History of cardiac cath [phone]    non obstructive CAD (done in [place]) EF 55%  . Herniated cervical disc   . Sciatica   . Diverticulitis   . Ulcerative colitis ([place])   . CAD (coronary artery disease)     non obstructive disease  . Biventricular heart failure ([place])   . Echocardiogram abnormal 10/17/12    EF 123456, grade I diastolic dysfunction, trivial MR, severely dilated LA  . Myocardial infarction ([place])   .  Peripheral vascular disease ([place])   . Anemia   . Chronic kidney disease     stage 4 chronic kidney disease, lasr saw dr Debroah Baller  . Achilles tendon rupture 07-01-2013    right, wears boot to knee  . Bruises easily     both legs  . OSA (obstructive sleep apnea)     cpap setting of 9  . DVT (deep venous thrombosis) ([place])   . Heart murmur   . H/O hiatal hernia   . Varicose veins     Current Outpatient Prescriptions  Medication Sig Dispense Refill  . albuterol (PROVENTIL HFA;VENTOLIN HFA) 108 (90 BASE)  MCG/ACT inhaler Inhale 2 puffs into the lungs 2 (two) times daily as needed for shortness of breath. For shortness of breath    . aspirin 81 MG chewable tablet Chew 81 mg by mouth every morning.     . carvedilol (COREG) 25 MG tablet Take 3.5 tablets (87.5 mg total) by mouth daily. TAKE TWO TABLETS IN THE MORNING AND ONE AND ONE-HALF AT NIGHT. 105 tablet 0  . cetirizine (ZYRTEC) 10 MG tablet Take 10 mg by mouth daily.    . cholecalciferol (VITAMIN D) 1000 UNITS tablet Take 1,000 Units by mouth daily.    . cholecalciferol (VITAMIN D) 1000 units tablet Take 1,000 Units by mouth daily.    . cinacalcet (SENSIPAR) 30 MG tablet Take 30 mg by mouth daily.    Donna Kitchen COLCRYS 0.6 MG tablet Take 1 tablet by mouth 2 (two) times daily.    Donna Kitchen docusate sodium (COLACE) 100 MG capsule Take 100 mg by mouth 2 (two) times daily.    . DULoxetine (CYMBALTA) 60 MG capsule Take 60 mg by mouth every evening.     . fish oil-omega-3 fatty acids 1000 MG capsule Take 1 g by mouth 3 (three) times daily.    Donna Kitchen gabapentin (NEURONTIN) 300 MG capsule Take 300 mg by mouth once.     . hydrALAZINE (APRESOLINE) 50 MG tablet Take 25 mg by mouth 2 (two) times daily.    . isosorbide mononitrate (IMDUR) 30 MG 24 hr tablet TAKE ONE TABLET BY MOUTH ONCE DAILY 30 tablet 0  . levothyroxine (SYNTHROID, LEVOTHROID) 100 MCG tablet Take 100 mcg by mouth daily before breakfast.     . Multiple Vitamins-Minerals (MULTIVITAMIN WITH MINERALS) tablet Take 1 tablet by mouth daily.    . nitroGLYCERIN (NITROSTAT) 0.4 MG SL tablet Place 0.4 mg under the tongue every 5 (five) minutes as needed for chest pain. For chest pain    . NOVOLIN N RELION 100 UNIT/ML injection Inject 20 Units into the skin 2 (two) times daily before a meal.     . NOVOLIN R 100 UNIT/ML injection Inject 5 Units into the skin as directed.    Donna Kitchen oxyCODONE-acetaminophen (PERCOCET/ROXICET) 5-325 MG tablet Take by mouth every 4 (four) hours as needed for severe pain.    . pantoprazole (PROTONIX) 20  MG tablet Take 20 mg by mouth 2 (two) times daily.    Donna Kitchen RELION INSULIN SYRINGE 1ML/31G 31G X 5/16" 1 ML MISC     . sevelamer carbonate (RENVELA) 800 MG tablet Take 800 mg by mouth 3 (three) times daily with meals.    . simvastatin (ZOCOR) 40 MG tablet Take 40 mg by mouth every evening.    . SYMBICORT 80-4.5 MCG/ACT inhaler Inhale 2 puffs into the lungs 2 (two) times daily.    Donna Kitchen tiZANidine (ZANAFLEX) 2 MG tablet Take 2 mg by mouth 3 (three) times  daily with meals.    . traZODone (DESYREL) 50 MG tablet Take 50 mg by mouth at bedtime.     . Turmeric 500 MG CAPS Take by mouth once.    . valsartan (DIOVAN) 320 MG tablet Take 320 mg by mouth at bedtime.    Donna Kitchen VICTOZA 18 MG/3ML SOPN Inject 1.2 Units into the skin every morning.      No current facility-administered medications for this visit.    Allergies:    Allergies  Allergen Reactions  . Ivp Dye [Iodinated Diagnostic Agents] Shortness Of Breath, Itching and Swelling    Only reaction with IV Dye.  Pt. states iodine on the skin has never caused reaction.   Donna Kitchen Raspberry Shortness Of Breath, Itching and Swelling    Social History:  The patient  reports that she has never smoked. She has never used smokeless tobacco. She reports that she does not drink alcohol or use illicit drugs.   Family history:   Family History  Problem Relation Age of Onset  . Diabetes type II Mother   . Hypertension Mother   . Hyperlipidemia Mother   . Kidney disease Mother   . Fibroids Mother   . Diabetes Mother   . Heart disease Mother   . Varicose Veins Mother   . Peripheral vascular disease Mother   . Hypertension Sister   . Hypertension Brother   . Hypertension Sister   . Hypertension Sister     ROS:  Please see the history of present illness.  All other systems reviewed and negative.   PHYSICAL EXAM: VS:  BP 104/62 mmHg  Pulse 72  Ht 5\' 8"  (1.727 m)  Wt 275 lb (124.739 kg)  BMI 41.82 kg/m2 Morbidly obese, well developed, in no acute  distress HEENT: Pupils are equal round react to light accommodation extraocular movements are intact.  Neck: no JVDNo cervical lymphadenopathy. Cardiac: Regular rate and rhythm without murmurs rubs or gallops. Lungs:  clear to auscultation bilaterally, no wheezing, rhonchi or rales Abd: soft, nontender, positive bowel sounds all quadrants Ext: no lower extremity edema.  2+ radial and dorsalis pedis pulses. Skin: warm and dry Neuro:  Grossly normal  EKG:   Normal sinus rhythm rate 74 bpm occasional PVCs   ASSESSMENT AND PLAN:  Problem List Items Addressed This Visit    OSA (obstructive sleep apnea)- C-pap compliant (Chronic)   Obesity- BMI 41 (Chronic)   Hypertensive cardiovascular disease- LVH, grade 1 diastolic dysfunction   Hypertension (Chronic)   Relevant Orders   EKG 12-Lead   Hyperlipidemia (Chronic)   CAD- non obstructive CAD- last cath 2013 in [place] - Primary (Chronic)   Relevant Orders   EKG 12-Lead      The patient is complaining of intermittent chest pain which is been ongoing now for probably a year. Is mostly at rest and at night. She doesn't have it when she is walking. She does use her nitroglycerin sometimes up to 3 times however, this does not sound like angina.   She had a low risk nonischemc nuclear stessed November 2015.   Her EKG is also unchanged from prior with no ischemic looking changes.   She continues on aspirin coreg imdur 30 mg Zocor 40 mg.   Blood pressure is controlled here in the office. She will continue to hold the Coreg, hydralazine and Diovan prior to dialysis. BP before dialysis has been around 154/118.  I think she needs to continue on nondialysis days and not decrease any doses.  She continues to be compliant with her CPAP. She does not appear to be volume overloaded at this time, however, given her size it is hard to tell.  Follow-up in 6 months with Dr. Claiborne Billings

## 2015-11-15 NOTE — Patient Instructions (Signed)
Your provider wants you to follow-up in: 6 months or sooner if needed with Dr Claiborne Billings. You will receive a reminder letter in the mail two months in advance. If you don't receive a letter, please call our office to schedule the follow-up appointment.   If you need a refill on your cardiac medications before your next appointment, please call your pharmacy.

## 2015-11-29 ENCOUNTER — Other Ambulatory Visit: Payer: Self-pay

## 2015-11-29 DIAGNOSIS — Z1231 Encounter for screening mammogram for malignant neoplasm of breast: Secondary | ICD-10-CM

## 2015-12-08 ENCOUNTER — Other Ambulatory Visit: Payer: Self-pay | Admitting: Cardiovascular Disease

## 2015-12-08 NOTE — Telephone Encounter (Signed)
Rx request sent to pharmacy.  

## 2015-12-13 ENCOUNTER — Telehealth: Payer: Self-pay | Admitting: *Deleted

## 2015-12-13 NOTE — Telephone Encounter (Signed)
PCP thought the "lumps" that the pt was describing were lypomas and said no varicosities and no swelling. Pt said they "move around". I left a message saying veins don't typically "move around". After phlebectomies, one can feel a hard area that usually soften at around 6-12 months. I said we would be happy to see her, but that the lumps did not sound like veins. Asked her to call me back if she has further questions.

## 2015-12-24 ENCOUNTER — Ambulatory Visit
Admission: RE | Admit: 2015-12-24 | Discharge: 2015-12-24 | Disposition: A | Discharge status: Home / Self Care | Payer: Medicare Other | Source: Ambulatory Visit

## 2015-12-24 DIAGNOSIS — Z1231 Encounter for screening mammogram for malignant neoplasm of breast: Secondary | ICD-10-CM

## 2016-01-18 ENCOUNTER — Other Ambulatory Visit: Payer: Self-pay | Admitting: Cardiovascular Disease

## 2016-01-18 NOTE — Telephone Encounter (Signed)
Rx has been sent to the pharmacy electronically. ° °

## 2016-01-28 HISTORY — PX: OTHER SURGICAL HISTORY: SHX169

## 2016-02-03 ENCOUNTER — Encounter (HOSPITAL_COMMUNITY): Payer: Self-pay | Admitting: *Deleted

## 2016-02-10 ENCOUNTER — Other Ambulatory Visit: Payer: Self-pay | Admitting: Gastroenterology

## 2016-02-14 ENCOUNTER — Encounter (HOSPITAL_COMMUNITY): Payer: Self-pay | Admitting: *Deleted

## 2016-02-14 ENCOUNTER — Encounter (HOSPITAL_COMMUNITY)
Admission: RE | Disposition: A | Discharge status: Home / Self Care | Payer: Self-pay | Source: Ambulatory Visit | Attending: Gastroenterology

## 2016-02-14 ENCOUNTER — Ambulatory Visit (HOSPITAL_COMMUNITY)
Admission: RE | Admit: 2016-02-14 | Discharge: 2016-02-14 | Disposition: A | Discharge status: Home / Self Care | Payer: Medicare Other | Source: Ambulatory Visit | Attending: Gastroenterology | Admitting: Gastroenterology

## 2016-02-14 ENCOUNTER — Ambulatory Visit (HOSPITAL_COMMUNITY): Payer: Medicare Other | Admitting: Anesthesiology

## 2016-02-14 DIAGNOSIS — Z794 Long term (current) use of insulin: Secondary | ICD-10-CM | POA: Diagnosis not present

## 2016-02-14 DIAGNOSIS — Z992 Dependence on renal dialysis: Secondary | ICD-10-CM | POA: Diagnosis not present

## 2016-02-14 DIAGNOSIS — E1151 Type 2 diabetes mellitus with diabetic peripheral angiopathy without gangrene: Secondary | ICD-10-CM | POA: Diagnosis not present

## 2016-02-14 DIAGNOSIS — G4730 Sleep apnea, unspecified: Secondary | ICD-10-CM | POA: Diagnosis not present

## 2016-02-14 DIAGNOSIS — Z86010 Personal history of colonic polyps: Secondary | ICD-10-CM | POA: Diagnosis not present

## 2016-02-14 DIAGNOSIS — I509 Heart failure, unspecified: Secondary | ICD-10-CM | POA: Insufficient documentation

## 2016-02-14 DIAGNOSIS — N186 End stage renal disease: Secondary | ICD-10-CM | POA: Insufficient documentation

## 2016-02-14 DIAGNOSIS — J45909 Unspecified asthma, uncomplicated: Secondary | ICD-10-CM | POA: Insufficient documentation

## 2016-02-14 DIAGNOSIS — K5730 Diverticulosis of large intestine without perforation or abscess without bleeding: Secondary | ICD-10-CM | POA: Diagnosis not present

## 2016-02-14 DIAGNOSIS — Z1211 Encounter for screening for malignant neoplasm of colon: Secondary | ICD-10-CM | POA: Diagnosis not present

## 2016-02-14 DIAGNOSIS — I252 Old myocardial infarction: Secondary | ICD-10-CM | POA: Insufficient documentation

## 2016-02-14 DIAGNOSIS — K219 Gastro-esophageal reflux disease without esophagitis: Secondary | ICD-10-CM | POA: Diagnosis not present

## 2016-02-14 DIAGNOSIS — M797 Fibromyalgia: Secondary | ICD-10-CM | POA: Diagnosis not present

## 2016-02-14 DIAGNOSIS — Z79899 Other long term (current) drug therapy: Secondary | ICD-10-CM | POA: Insufficient documentation

## 2016-02-14 DIAGNOSIS — K640 First degree hemorrhoids: Secondary | ICD-10-CM | POA: Insufficient documentation

## 2016-02-14 DIAGNOSIS — I132 Hypertensive heart and chronic kidney disease with heart failure and with stage 5 chronic kidney disease, or end stage renal disease: Secondary | ICD-10-CM | POA: Diagnosis not present

## 2016-02-14 DIAGNOSIS — Z86718 Personal history of other venous thrombosis and embolism: Secondary | ICD-10-CM | POA: Diagnosis not present

## 2016-02-14 DIAGNOSIS — I2510 Atherosclerotic heart disease of native coronary artery without angina pectoris: Secondary | ICD-10-CM | POA: Diagnosis not present

## 2016-02-14 DIAGNOSIS — E1122 Type 2 diabetes mellitus with diabetic chronic kidney disease: Secondary | ICD-10-CM | POA: Diagnosis not present

## 2016-02-14 DIAGNOSIS — Z7982 Long term (current) use of aspirin: Secondary | ICD-10-CM | POA: Insufficient documentation

## 2016-02-14 DIAGNOSIS — Z6841 Body Mass Index (BMI) 40.0 and over, adult: Secondary | ICD-10-CM | POA: Diagnosis not present

## 2016-02-14 HISTORY — PX: COLONOSCOPY WITH PROPOFOL: SHX5780

## 2016-02-14 LAB — GLUCOSE, CAPILLARY
GLUCOSE-CAPILLARY: 69 mg/dL (ref 65–99)
Glucose-Capillary: 98 mg/dL (ref 65–99)
Glucose-Capillary: 70 mg/dL (ref 65–99)

## 2016-02-14 SURGERY — COLONOSCOPY WITH PROPOFOL
Anesthesia: Monitor Anesthesia Care

## 2016-02-14 MED ORDER — SODIUM CHLORIDE 0.9 % IV SOLN
INTRAVENOUS | Status: DC
  Administered 2016-02-14: 09:00:00 via INTRAVENOUS

## 2016-02-14 MED ORDER — PROPOFOL 500 MG/50ML IV EMUL
INTRAVENOUS | Status: DC | PRN
  Administered 2016-02-14: 100 ug/kg/min via INTRAVENOUS

## 2016-02-14 MED ORDER — DEXTROSE 50 % IV SOLN
INTRAVENOUS | Status: AC
  Filled 2016-02-14: qty 50

## 2016-02-14 MED ORDER — PROPOFOL 10 MG/ML IV BOLUS
INTRAVENOUS | Status: DC | PRN
  Administered 2016-02-14: 20 mg via INTRAVENOUS

## 2016-02-14 MED ORDER — PROPOFOL 10 MG/ML IV BOLUS
INTRAVENOUS | Status: AC
  Filled 2016-02-14: qty 40

## 2016-02-14 MED ORDER — DEXTROSE 50 % IV SOLN
25.0000 mL | Freq: Once | INTRAVENOUS | Status: AC
  Administered 2016-02-14: 25 mL via INTRAVENOUS

## 2016-02-14 MED ORDER — LIDOCAINE HCL (CARDIAC) 20 MG/ML IV SOLN
INTRAVENOUS | Status: DC | PRN
  Administered 2016-02-14: 50 mg via INTRAVENOUS

## 2016-02-14 MED ORDER — LIDOCAINE HCL (CARDIAC) 20 MG/ML IV SOLN
INTRAVENOUS | Status: AC
  Filled 2016-02-14: qty 5

## 2016-02-14 MED ORDER — LACTATED RINGERS IV SOLN: INTRAVENOUS | Status: DC

## 2016-02-14 SURGICAL SUPPLY — 21 items

## 2016-02-14 NOTE — Anesthesia Postprocedure Evaluation (Signed)
Anesthesia Post Note  Patient: Donna Sweeney  Procedure(s) Performed: Procedure(s) (LRB): COLONOSCOPY WITH PROPOFOL (N/A)  Patient location during evaluation: Endoscopy Anesthesia Type: MAC Level of consciousness: awake and alert Pain management: pain level controlled Vital Signs Assessment: post-procedure vital signs reviewed and stable Respiratory status: spontaneous breathing, nonlabored ventilation, respiratory function stable and patient connected to nasal cannula oxygen Cardiovascular status: stable and blood pressure returned to baseline Anesthetic complications: no    Last Vitals:  Filed Vitals:   02/14/16 1020 02/14/16 1030  BP: 129/57 154/76  Pulse: 63 65  Temp:    Resp: 14 18    Last Pain: There were no vitals filed for this visit.               Catalina Gravel

## 2016-02-14 NOTE — Transfer of Care (Signed)
Immediate Anesthesia Transfer of Care Note  Patient: Donna Sweeney  Procedure(s) Performed: Procedure(s): COLONOSCOPY WITH PROPOFOL (N/A)  Patient Location: PACU  Anesthesia Type:MAC  Level of Consciousness: awake, alert  and oriented  Airway & Oxygen Therapy: Patient Spontanous Breathing and Patient connected to face mask oxygen  Post-op Assessment: Report given to RN and Post -op Vital signs reviewed and stable  Post vital signs: Reviewed and stable  Last Vitals:  Filed Vitals:   02/14/16 0820  BP: 136/82  Pulse: 65  Temp: 36.7 C  Resp: 13    Last Pain: There were no vitals filed for this visit.       Complications: No apparent anesthesia complications

## 2016-02-14 NOTE — Progress Notes (Signed)
CBG 69 at 0845 in preprocedure for colonoscopy. Patient denies any symptoms of hypoglycemia.  Dr. Gifford Shave notified, order received to give 90ml Of Dextrose 50%. Recheck 15 min later CBG 98.

## 2016-02-14 NOTE — Anesthesia Preprocedure Evaluation (Addendum)
Anesthesia Evaluation  Patient identified by MRN, date of birth, ID band Patient awake    Reviewed: Allergy & Precautions, NPO status , Patient's Chart, lab work & pertinent test results, reviewed documented beta blocker date and time   Airway Mallampati: III  TM Distance: >3 FB Neck ROM: Full    Dental  (+) Dental Advisory Given, Partial Lower, Partial Upper   Pulmonary asthma , sleep apnea (9cmH2O) and Continuous Positive Airway Pressure Ventilation ,    Pulmonary exam normal breath sounds clear to auscultation       Cardiovascular hypertension, Pt. on medications and Pt. on home beta blockers + CAD, + Past MI, + Peripheral Vascular Disease, +CHF and + DVT  Normal cardiovascular exam Rhythm:Regular Rate:Normal  Echo 2014: EF 123456, grade I diastolic dysfunction, trivial MR, severely dilated LA   Neuro/Psych  Neuromuscular disease negative psych ROS   GI/Hepatic Neg liver ROS, hiatal hernia, PUD, GERD  Medicated and Controlled,  Endo/Other  diabetes, Type 2, Insulin DependentHypothyroidism Morbid obesity  Renal/GU ESRF and DialysisRenal disease (T/Th/Sat dialysis; last dialyzed 02/12/16)     Musculoskeletal  (+) Arthritis , Osteoarthritis and Rheumatoid disorders,  Fibromyalgia -  Abdominal   Peds  Hematology negative hematology ROS (+) anemia ,   Anesthesia Other Findings Day of surgery medications reviewed with the patient.  Reproductive/Obstetrics                          Anesthesia Physical Anesthesia Plan  ASA: III  Anesthesia Plan: MAC   Post-op Pain Management:    Induction: Intravenous  Airway Management Planned: Nasal Cannula  Additional Equipment:   Intra-op Plan:   Post-operative Plan:   Informed Consent: I have reviewed the patients History and Physical, chart, labs and discussed the procedure including the risks, benefits and alternatives for the proposed anesthesia  with the patient or authorized representative who has indicated his/her understanding and acceptance.   Dental advisory given  Plan Discussed with: CRNA and Anesthesiologist  Anesthesia Plan Comments: (Discussed risks/benefits/alternatives to MAC sedation including need for ventilatory support, hypotension, need for conversion to general anesthesia.  All patient questions answered.  Patient/guardian wishes to proceed.)        Anesthesia Quick Evaluation

## 2016-02-14 NOTE — Op Note (Signed)
[facility] Patient Name: Donna Sweeney Procedure Date: 02/14/2016 MRN: [id number] Attending MD: Lear Ng , MD Date of Birth: May 20, 1946 CSN: [id number] Age: 70 Admit Type: Outpatient Procedure:                Colonoscopy Indications:              High risk colon cancer surveillance: Personal                            history of colonic polyps, Last colonoscopy:                            January 2015 Providers:                Lear Ng, MD, Cleda Daub, RN, Alfonso Patten, Technician, [facility], CRNA Referring MD:              Medicines:                Propofol per Anesthesia, Monitored Anesthesia Care Complications:            No immediate complications. Estimated Blood Loss:     Estimated blood loss: none. Procedure:                Pre-Anesthesia Assessment:                           - Prior to the procedure, a History and Physical                            was performed, and patient medications and                            allergies were reviewed. The patient's tolerance of                            previous anesthesia was also reviewed. The risks                            and benefits of the procedure and the sedation                            options and risks were discussed with the patient.                            All questions were answered, and informed consent                            was obtained. Prior Anticoagulants: The patient has                            taken no previous anticoagulant or antiplatelet  agents. ASA Grade Assessment: III - A patient with                            severe systemic disease. After reviewing the risks                            and benefits, the patient was deemed in                            satisfactory condition to undergo the procedure.                           After obtaining informed consent, the colonoscope                   was passed under direct vision. Throughout the                            procedure, the patient's blood pressure, pulse, and                            oxygen saturations were monitored continuously. The                            [id number] scope was introduced through                            the anus and advanced to the the cecum, identified                            by appendiceal orifice and ileocecal valve. The                            colonoscopy was performed without difficulty. The                            patient tolerated the procedure well. The quality                            of the bowel preparation was adequate and good. The                            terminal ileum, the appendiceal orifice and the                            rectum were photographed. Scope In: 9:35:42 AM Scope Out: 9:54:02 AM Scope Withdrawal Time: 0 hours 5 minutes 14 seconds  Total Procedure Duration: 0 hours 18 minutes 20 seconds  Findings:      The perianal and digital rectal examinations were normal.      A few small-mouthed diverticula were found in the sigmoid colon.      Internal hemorrhoids were found during anoscopy. The hemorrhoids were       small and Grade I (internal hemorrhoids that do not prolapse). Unable to       retroflex  in rectum due to small rectal vault but careful inspection of       anorectum done with slow withdrawal. Impression:               - Diverticulosis in the sigmoid colon.                           - Internal hemorrhoids.                           - No specimens collected. Moderate Sedation:      N/A- Per Anesthesia Care Recommendation:           - Patient has a contact number available for                            emergencies. The signs and symptoms of potential                            delayed complications were discussed with the                            patient. Return to normal activities tomorrow.                             Written discharge instructions were provided to the                            patient.                           - Repeat colonoscopy in 5 years for surveillance.                           - High fiber diet.                           - Continue present medications. Procedure Code(s):        --- Professional ---                           [phone], Colonoscopy, flexible; diagnostic, including                            collection of specimen(s) by brushing or washing,                            when performed (separate procedure) Diagnosis Code(s):        --- Professional ---                           Z86.010, Personal history of colonic polyps                           K64.0, First degree hemorrhoids                           K57.30, Diverticulosis of  large intestine without                            perforation or abscess without bleeding CPT copyright 2016 American Medical Association. All rights reserved. The codes documented in this report are preliminary and upon coder review may  be revised to meet current compliance requirements. Lear Ng, MD 02/14/2016 10:03:16 AM This report has been signed electronically. Number of Addenda: 0

## 2016-02-14 NOTE — H&P (Signed)
  Date of Initial H&P: 02/07/16  History reviewed, patient examined, no change in status, stable for surgery.

## 2016-02-14 NOTE — Interval H&P Note (Signed)
History and Physical Interval Note:  02/14/2016 9:04 AM  Donna Sweeney  has presented today for surgery, with the diagnosis of hx of polyps  The various methods of treatment have been discussed with the patient and family. After consideration of risks, benefits and other options for treatment, the patient has consented to  Procedure(s): COLONOSCOPY WITH PROPOFOL (N/A) as a surgical intervention .  The patient's history has been reviewed, patient examined, no change in status, stable for surgery.  I have reviewed the patient's chart and labs.  Questions were answered to the patient's satisfaction.     [name]

## 2016-02-14 NOTE — Discharge Instructions (Addendum)
Repeat colonoscopy in 5 years.  Colonoscopy, Care After These instructions give you information on caring for yourself after your procedure. Your doctor may also give you more specific instructions. Call your doctor if you have any problems or questions after your procedure. HOME CARE  Do not drive for 24 hours.  Do not sign important papers or use machinery for 24 hours.  You may shower.  You may go back to your usual activities, but go slower for the first 24 hours.  Take rest breaks often during the first 24 hours.  Walk around or use warm packs on your belly (abdomen) if you have belly cramping or gas.  Drink enough fluids to keep your pee (urine) clear or pale yellow.  Resume your normal diet. Avoid heavy or fried foods.  Avoid drinking alcohol for 24 hours or as told by your doctor.  Only take medicines as told by your doctor. If a tissue sample (biopsy) was taken during the procedure:   Do not take aspirin or blood thinners for 7 days, or as told by your doctor.  Do not drink alcohol for 7 days, or as told by your doctor.  Eat soft foods for the first 24 hours. GET HELP IF: You still have a small amount of blood in your poop (stool) 2-3 days after the procedure. GET HELP RIGHT AWAY IF:  You have more than a small amount of blood in your poop.  You see clumps of tissue (blood clots) in your poop.  Your belly is puffy (swollen).  You feel sick to your stomach (nauseous) or throw up (vomit).  You have a fever.  You have belly pain that gets worse and medicine does not help. MAKE SURE YOU:  Understand these instructions.  Will watch your condition.  Will get help right away if you are not doing well or get worse.   This information is not intended to replace advice given to you by your health care provider. Make sure you discuss any questions you have with your health care provider.   Document Released: 09/09/2010 Document Revised: 08/12/2013 Document  Reviewed: 04/14/2013 Elsevier Interactive Patient Education [employer].

## 2016-02-15 ENCOUNTER — Encounter (HOSPITAL_COMMUNITY): Payer: Self-pay | Admitting: Gastroenterology

## 2016-05-17 ENCOUNTER — Other Ambulatory Visit: Payer: Self-pay | Admitting: Cardiovascular Disease
